# Patient Record
Sex: Male | Born: 1975 | Race: White | Hispanic: No | Marital: Single | Smoking: Current every day smoker
Health system: Southern US, Community
[De-identification: ages and names within clinical notes are randomized; demographics above are authoritative.]

## PROBLEM LIST (undated history)

## (undated) DIAGNOSIS — M1990 Unspecified osteoarthritis, unspecified site: Secondary | ICD-10-CM

## (undated) DIAGNOSIS — E119 Type 2 diabetes mellitus without complications: Secondary | ICD-10-CM

## (undated) DIAGNOSIS — F17200 Nicotine dependence, unspecified, uncomplicated: Secondary | ICD-10-CM

## (undated) DIAGNOSIS — I10 Essential (primary) hypertension: Secondary | ICD-10-CM

---

## 2019-04-05 ENCOUNTER — Other Ambulatory Visit: Payer: Self-pay

## 2019-04-05 ENCOUNTER — Emergency Department: Payer: Medicaid Other

## 2019-04-05 ENCOUNTER — Emergency Department
Admission: EM | Admit: 2019-04-05 | Discharge: 2019-04-06 | Disposition: A | Discharge status: Home / Self Care | Payer: Medicaid Other | Attending: Emergency Medicine | Admitting: Emergency Medicine

## 2019-04-05 ENCOUNTER — Encounter: Payer: Self-pay | Admitting: Emergency Medicine

## 2019-04-05 DIAGNOSIS — S299XXA Unspecified injury of thorax, initial encounter: Secondary | ICD-10-CM | POA: Diagnosis present

## 2019-04-05 DIAGNOSIS — Y92410 Unspecified street and highway as the place of occurrence of the external cause: Secondary | ICD-10-CM | POA: Diagnosis not present

## 2019-04-05 DIAGNOSIS — F10929 Alcohol use, unspecified with intoxication, unspecified: Secondary | ICD-10-CM | POA: Diagnosis not present

## 2019-04-05 DIAGNOSIS — F17210 Nicotine dependence, cigarettes, uncomplicated: Secondary | ICD-10-CM | POA: Insufficient documentation

## 2019-04-05 DIAGNOSIS — Y9389 Activity, other specified: Secondary | ICD-10-CM | POA: Diagnosis not present

## 2019-04-05 DIAGNOSIS — I10 Essential (primary) hypertension: Secondary | ICD-10-CM | POA: Diagnosis not present

## 2019-04-05 DIAGNOSIS — S20213A Contusion of bilateral front wall of thorax, initial encounter: Secondary | ICD-10-CM

## 2019-04-05 DIAGNOSIS — Y999 Unspecified external cause status: Secondary | ICD-10-CM | POA: Diagnosis not present

## 2019-04-05 DIAGNOSIS — Y908 Blood alcohol level of 240 mg/100 ml or more: Secondary | ICD-10-CM | POA: Insufficient documentation

## 2019-04-05 DIAGNOSIS — E119 Type 2 diabetes mellitus without complications: Secondary | ICD-10-CM | POA: Diagnosis not present

## 2019-04-05 DIAGNOSIS — R55 Syncope and collapse: Secondary | ICD-10-CM | POA: Insufficient documentation

## 2019-04-05 DIAGNOSIS — R52 Pain, unspecified: Secondary | ICD-10-CM

## 2019-04-05 HISTORY — DX: Essential (primary) hypertension: I10

## 2019-04-05 HISTORY — DX: Unspecified osteoarthritis, unspecified site: M19.90

## 2019-04-05 HISTORY — DX: Type 2 diabetes mellitus without complications: E11.9

## 2019-04-05 LAB — CBC
HCT: 43.8 % (ref 39.0–52.0)
Hemoglobin: 14.4 g/dL (ref 13.0–17.0)
MCH: 33.1 pg (ref 26.0–34.0)
MCHC: 32.9 g/dL (ref 30.0–36.0)
MCV: 100.7 fL — ABNORMAL HIGH (ref 80.0–100.0)
Platelets: 345 10*3/uL (ref 150–400)
RBC: 4.35 MIL/uL (ref 4.22–5.81)
RDW: 11.8 % (ref 11.5–15.5)
WBC: 9.6 10*3/uL (ref 4.0–10.5)
nRBC: 0 % (ref 0.0–0.2)

## 2019-04-05 LAB — URINALYSIS, COMPLETE (UACMP) WITH MICROSCOPIC
Bacteria, UA: NONE SEEN
Bilirubin Urine: NEGATIVE
Glucose, UA: >=500 mg/dL — AB
Hgb urine dipstick: NEGATIVE
Ketones, ur: NEGATIVE mg/dL
Leukocytes,Ua: NEGATIVE
Nitrite: NEGATIVE
Protein, ur: NEGATIVE mg/dL
Specific Gravity, Urine: 1.01 (ref 1.005–1.030)
Squamous Epithelial / HPF: NONE SEEN (ref 0–5)
pH: 5 (ref 5.0–8.0)

## 2019-04-05 LAB — BASIC METABOLIC PANEL
Anion gap: 18 — ABNORMAL HIGH (ref 5–15)
BUN: 18 mg/dL (ref 6–20)
CO2: 24 mmol/L (ref 22–32)
Calcium: 9.8 mg/dL (ref 8.9–10.3)
Chloride: 90 mmol/L — ABNORMAL LOW (ref 98–111)
Creatinine, Ser: 0.62 mg/dL (ref 0.61–1.24)
GFR calc Af Amer: >60 mL/min (ref >60)
GFR calc non Af Amer: >60 mL/min (ref >60)
Glucose, Bld: 308 mg/dL — ABNORMAL HIGH (ref 70–99)
Potassium: 3.6 mmol/L (ref 3.5–5.1)
Sodium: 132 mmol/L — ABNORMAL LOW (ref 135–145)

## 2019-04-05 LAB — ETHANOL: Alcohol, Ethyl (B): 262 mg/dL — ABNORMAL HIGH (ref <10)

## 2019-04-05 LAB — GLUCOSE, CAPILLARY: Glucose-Capillary: 319 mg/dL — ABNORMAL HIGH (ref 70–99)

## 2019-04-05 MED ORDER — SODIUM CHLORIDE 0.9% FLUSH
3.0000 mL | Freq: Once | INTRAVENOUS | Status: AC
  Administered 2019-04-06: 04:00:00 3 mL via INTRAVENOUS
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
## 2019-04-05 NOTE — ED Notes (Signed)
Received call from CT, that patient ripped off his cervical collar and refusing to put it on.

## 2019-04-06 ENCOUNTER — Emergency Department: Payer: Medicaid Other

## 2019-04-06 ENCOUNTER — Encounter: Payer: Self-pay | Admitting: Radiology

## 2019-04-06 LAB — GLUCOSE, CAPILLARY
Glucose-Capillary: 231 mg/dL — ABNORMAL HIGH (ref 70–99)
Glucose-Capillary: 235 mg/dL — ABNORMAL HIGH (ref 70–99)

## 2019-04-06 MED ORDER — KETOROLAC TROMETHAMINE 30 MG/ML IJ SOLN
30.0000 mg | Freq: Once | INTRAMUSCULAR | Status: AC
  Administered 2019-04-06: 04:00:00 30 mg via INTRAVENOUS
  Filled 2019-04-06: qty 1

## 2019-04-06 MED ORDER — SODIUM CHLORIDE 0.9 % IV BOLUS: 1000.0000 mL | Freq: Once | INTRAVENOUS | Status: DC

## 2019-04-06 MED ORDER — IOHEXOL 300 MG/ML  SOLN
75.0000 mL | Freq: Once | INTRAMUSCULAR | Status: AC | PRN
  Administered 2019-04-06: 03:00:00 75 mL via INTRAVENOUS
  Filled 2019-04-06: qty 75

## 2019-04-06 NOTE — ED Notes (Signed)
Pt using bathroom prior to discharge.

## 2019-04-06 NOTE — ED Notes (Signed)
Pt sleeping, easily aroused. No concerns at this time.
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
## 2021-09-30 ENCOUNTER — Emergency Department: Payer: Self-pay

## 2021-09-30 DIAGNOSIS — Z515 Encounter for palliative care: Secondary | ICD-10-CM

## 2021-09-30 DIAGNOSIS — F419 Anxiety disorder, unspecified: Secondary | ICD-10-CM | POA: Diagnosis present

## 2021-09-30 DIAGNOSIS — E232 Diabetes insipidus: Secondary | ICD-10-CM | POA: Diagnosis present

## 2021-09-30 DIAGNOSIS — J9602 Acute respiratory failure with hypercapnia: Secondary | ICD-10-CM | POA: Diagnosis present

## 2021-09-30 DIAGNOSIS — Z794 Long term (current) use of insulin: Secondary | ICD-10-CM

## 2021-09-30 DIAGNOSIS — K7200 Acute and subacute hepatic failure without coma: Secondary | ICD-10-CM | POA: Diagnosis present

## 2021-09-30 DIAGNOSIS — E870 Hyperosmolality and hypernatremia: Secondary | ICD-10-CM | POA: Diagnosis present

## 2021-09-30 DIAGNOSIS — E876 Hypokalemia: Secondary | ICD-10-CM | POA: Diagnosis present

## 2021-09-30 DIAGNOSIS — I110 Hypertensive heart disease with heart failure: Secondary | ICD-10-CM | POA: Diagnosis present

## 2021-09-30 DIAGNOSIS — R578 Other shock: Secondary | ICD-10-CM | POA: Diagnosis present

## 2021-09-30 DIAGNOSIS — Z66 Do not resuscitate: Secondary | ICD-10-CM | POA: Diagnosis not present

## 2021-09-30 DIAGNOSIS — I462 Cardiac arrest due to underlying cardiac condition: Secondary | ICD-10-CM | POA: Diagnosis present

## 2021-09-30 DIAGNOSIS — I272 Pulmonary hypertension, unspecified: Secondary | ICD-10-CM | POA: Diagnosis present

## 2021-09-30 DIAGNOSIS — I4901 Ventricular fibrillation: Principal | ICD-10-CM | POA: Diagnosis present

## 2021-09-30 DIAGNOSIS — Z529 Donor of unspecified organ or tissue: Secondary | ICD-10-CM

## 2021-09-30 DIAGNOSIS — M1990 Unspecified osteoarthritis, unspecified site: Secondary | ICD-10-CM | POA: Diagnosis present

## 2021-09-30 DIAGNOSIS — G939 Disorder of brain, unspecified: Secondary | ICD-10-CM

## 2021-09-30 DIAGNOSIS — G9382 Brain death: Secondary | ICD-10-CM | POA: Diagnosis present

## 2021-09-30 DIAGNOSIS — J9601 Acute respiratory failure with hypoxia: Secondary | ICD-10-CM | POA: Diagnosis present

## 2021-09-30 DIAGNOSIS — E872 Acidosis, unspecified: Secondary | ICD-10-CM

## 2021-09-30 DIAGNOSIS — F17210 Nicotine dependence, cigarettes, uncomplicated: Secondary | ICD-10-CM | POA: Diagnosis present

## 2021-09-30 DIAGNOSIS — E1110 Type 2 diabetes mellitus with ketoacidosis without coma: Secondary | ICD-10-CM | POA: Diagnosis present

## 2021-09-30 DIAGNOSIS — I4891 Unspecified atrial fibrillation: Secondary | ICD-10-CM | POA: Diagnosis present

## 2021-09-30 DIAGNOSIS — I255 Ischemic cardiomyopathy: Secondary | ICD-10-CM | POA: Diagnosis present

## 2021-09-30 DIAGNOSIS — G928 Other toxic encephalopathy: Secondary | ICD-10-CM | POA: Diagnosis present

## 2021-09-30 DIAGNOSIS — Z20822 Contact with and (suspected) exposure to covid-19: Secondary | ICD-10-CM | POA: Diagnosis present

## 2021-09-30 DIAGNOSIS — I213 ST elevation (STEMI) myocardial infarction of unspecified site: Secondary | ICD-10-CM | POA: Diagnosis present

## 2021-09-30 DIAGNOSIS — Z6833 Body mass index (BMI) 33.0-33.9, adult: Secondary | ICD-10-CM

## 2021-09-30 DIAGNOSIS — I469 Cardiac arrest, cause unspecified: Principal | ICD-10-CM

## 2021-09-30 DIAGNOSIS — E669 Obesity, unspecified: Secondary | ICD-10-CM | POA: Diagnosis present

## 2021-09-30 DIAGNOSIS — G931 Anoxic brain damage, not elsewhere classified: Secondary | ICD-10-CM | POA: Diagnosis present

## 2021-09-30 DIAGNOSIS — G936 Cerebral edema: Secondary | ICD-10-CM | POA: Diagnosis present

## 2021-09-30 DIAGNOSIS — J9811 Atelectasis: Secondary | ICD-10-CM | POA: Diagnosis present

## 2021-09-30 DIAGNOSIS — Z880 Allergy status to penicillin: Secondary | ICD-10-CM

## 2021-09-30 DIAGNOSIS — I5030 Unspecified diastolic (congestive) heart failure: Secondary | ICD-10-CM | POA: Diagnosis present

## 2021-09-30 DIAGNOSIS — I2510 Atherosclerotic heart disease of native coronary artery without angina pectoris: Secondary | ICD-10-CM | POA: Diagnosis present

## 2021-09-30 HISTORY — DX: Nicotine dependence, unspecified, uncomplicated: F17.200

## 2021-09-30 LAB — CBC WITH DIFFERENTIAL/PLATELET
Abs Immature Granulocytes: 1.32 10*3/uL — ABNORMAL HIGH (ref 0.00–0.07)
Basophils Absolute: 0.1 10*3/uL (ref 0.0–0.1)
Basophils Relative: 1 %
Eosinophils Absolute: 0.3 10*3/uL (ref 0.0–0.5)
Eosinophils Relative: 2 %
HCT: 41.6 % (ref 39.0–52.0)
Hemoglobin: 13 g/dL (ref 13.0–17.0)
Immature Granulocytes: 10 %
Lymphocytes Relative: 42 %
Lymphs Abs: 5.3 10*3/uL — ABNORMAL HIGH (ref 0.7–4.0)
MCH: 32.4 pg (ref 26.0–34.0)
MCHC: 31.3 g/dL (ref 30.0–36.0)
MCV: 103.7 fL — ABNORMAL HIGH (ref 80.0–100.0)
Monocytes Absolute: 0.7 10*3/uL (ref 0.1–1.0)
Monocytes Relative: 5 %
Neutro Abs: 5.1 10*3/uL (ref 1.7–7.7)
Neutrophils Relative %: 40 %
Platelets: 293 10*3/uL (ref 150–400)
RBC: 4.01 MIL/uL — ABNORMAL LOW (ref 4.22–5.81)
RDW: 11.9 % (ref 11.5–15.5)
WBC: 12.9 10*3/uL — ABNORMAL HIGH (ref 4.0–10.5)
nRBC: 0.2 % (ref 0.0–0.2)

## 2021-09-30 LAB — CBC
HCT: 42.9 % (ref 39.0–52.0)
Hemoglobin: 13 g/dL (ref 13.0–17.0)
MCH: 31.9 pg (ref 26.0–34.0)
MCHC: 30.3 g/dL (ref 30.0–36.0)
MCV: 105.1 fL — ABNORMAL HIGH (ref 80.0–100.0)
Platelets: 281 10*3/uL (ref 150–400)
RBC: 4.08 MIL/uL — ABNORMAL LOW (ref 4.22–5.81)
RDW: 11.9 % (ref 11.5–15.5)
WBC: 10.8 10*3/uL — ABNORMAL HIGH (ref 4.0–10.5)
nRBC: 0.3 % — ABNORMAL HIGH (ref 0.0–0.2)

## 2021-09-30 LAB — BRAIN NATRIURETIC PEPTIDE: B Natriuretic Peptide: 61.4 pg/mL (ref 0.0–100.0)

## 2021-09-30 LAB — PROTIME-INR
INR: 1.2 (ref 0.8–1.2)
INR: 1.2 (ref 0.8–1.2)
Prothrombin Time: 14.8 seconds (ref 11.4–15.2)
Prothrombin Time: 14.8 seconds (ref 11.4–15.2)

## 2021-09-30 LAB — SALICYLATE LEVEL: Salicylate Lvl: <7 mg/dL — ABNORMAL LOW (ref 7.0–30.0)

## 2021-09-30 LAB — CBG MONITORING, ED: Glucose-Capillary: 496 mg/dL — ABNORMAL HIGH (ref 70–99)

## 2021-09-30 LAB — APTT
aPTT: 41 seconds — ABNORMAL HIGH (ref 24–36)
aPTT: 48 seconds — ABNORMAL HIGH (ref 24–36)

## 2021-09-30 LAB — ACETAMINOPHEN LEVEL: Acetaminophen (Tylenol), Serum: <10 ug/mL — ABNORMAL LOW (ref 10–30)

## 2021-09-30 MED ORDER — EPINEPHRINE 1 MG/10ML IJ SOSY
PREFILLED_SYRINGE | INTRAMUSCULAR | Status: DC | PRN
  Administered 2021-09-30: 1 mg via INTRAVENOUS

## 2021-09-30 MED ORDER — EPINEPHRINE 1 MG/10ML IJ SOSY
PREFILLED_SYRINGE | INTRAMUSCULAR | Status: AC | PRN
  Administered 2021-09-30: 1 mg via INTRAVENOUS

## 2021-09-30 MED ORDER — SODIUM CHLORIDE 0.9 % IV SOLN
INTRAVENOUS | Status: AC | PRN
Start: 2021-09-30 — End: 2021-09-30
  Administered 2021-09-30 (×2): 1000 mL via INTRAVENOUS

## 2021-09-30 MED ORDER — SODIUM BICARBONATE 8.4 % IV SOLN
INTRAVENOUS | Status: AC | PRN
  Administered 2021-09-30 (×2): 50 meq via INTRAVENOUS

## 2021-09-30 MED ORDER — EPINEPHRINE 1 MG/10ML IJ SOSY
PREFILLED_SYRINGE | INTRAMUSCULAR | Status: AC
  Filled 2021-09-30: qty 10

## 2021-09-30 MED ORDER — EPINEPHRINE HCL 5 MG/250ML IV SOLN IN NS
0.5000 ug/min | INTRAVENOUS | Status: DC
  Administered 2021-09-30 – 2021-10-01 (×2): 10 ug/min via INTRAVENOUS
  Administered 2021-10-01 (×2): 12 ug/min via INTRAVENOUS
  Administered 2021-10-01: 5 ug/min via INTRAVENOUS
  Administered 2021-10-02: 15 ug/min via INTRAVENOUS
  Administered 2021-10-02: 12 ug/min via INTRAVENOUS
  Filled 2021-09-30 (×6): qty 250

## 2021-09-30 MED ORDER — NOREPINEPHRINE 4 MG/250ML-% IV SOLN
INTRAVENOUS | Status: AC | PRN
  Administered 2021-09-30: 10 ug/min via INTRAVENOUS

## 2021-09-30 MED ORDER — SODIUM CHLORIDE 0.9 % IV SOLN: INTRAVENOUS | Status: DC
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
## 2021-09-30 NOTE — ED Triage Notes (Signed)
Pt arrives in cardiac arrest. See code blue narrator.  ?
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
## 2021-09-30 NOTE — Code Documentation (Signed)
[place] placed R [place] RN ED ?

## 2021-09-30 NOTE — Code Documentation (Signed)
Cbg 496 ?

## 2021-09-30 NOTE — Code Documentation (Signed)
1MG  epi given  ?

## 2021-09-30 NOTE — Code Documentation (Signed)
1G calcium given  ?

## 2021-09-30 NOTE — ED Notes (Signed)
Activated Code Stemi per Dr. Fuller Plan ?

## 2021-09-30 NOTE — Code Documentation (Signed)
Pulse check CPR resumed

## 2021-09-30 NOTE — ED Notes (Signed)
50mg TNK given 

## 2021-09-30 NOTE — Consult Note (Signed)
[facility] Cardiology ? ?CARDIOLOGY CONSULT NOTE  ?Patient ID: ?Jose Winters ?MRN: [id number] ?DOB/AGE: 1976/05/20 45 y.o. ? ?Admit date: 09/23/2021 ?Referring Physician Fuller Plan ?Primary Physician [facility] primary ?Primary Cardiologist  ?Reason for Consultation cardiopulmonary arrest ? ?HPI: 46 year old gentleman referred for possible code STEMI.  3 obtained from patient's sister.  The patient and his sister were out earlier this evening, had several alcoholic beverages.  He returned to his apartment, ate dinner, was with friends, had more alcoholic beverages and possible illicit drugs, when he became pulseless.  EMS was called, upon arrival is noted to be in PEA, CPR and epinephrine x2 were given, developed ventricular fibrillation, shocked twice, given amiodarone, and was brought to [facility] ED, where again developed PEA, treated with epinephrine x2 and 1 g of calcium, started on Levophed infusion, and intubated.  ECG revealed ectopic atrial tachycardia, diffuse ST-T wave abnormalities, without definitive and diagnostic ST elevation.  The patient was treated with TNKase thrombolytics.  Lab work revealed alcohol level 262.  Head CT consistent with cerebral edema.  Patient obtunded with pupils fixed and dilated. ? ?Review of systems complete and found to be negative unless listed above  ? ? ? ?Past Medical History:  ?Diagnosis Date  ? Arthritis   ? Diabetes (HCC)   ? Hypertension   ?  ?No past surgical history on file.  ?(Not in a hospital admission) ? ?Social History  ? ?Socioeconomic History  ? Marital status: Single  ?  Spouse name: Not on file  ? Number of children: Not on file  ? Years of education: Not on file  ? Highest education level: Not on file  ?Occupational History  ? Not on file  ?Tobacco Use  ? Smoking status: Every Day  ?  Packs/day: 0.50  ?  Types: Cigarettes  ? Smokeless tobacco: Never  ?Substance and Sexual Activity  ? Alcohol use: Yes  ? Drug use: Yes  ?  Types: Marijuana  ?  Comment: last smoked earlier today  ?  Sexual activity: Not on file  ?Other Topics Concern  ? Not on file  ?Social History Narrative  ? Not on file  ? ?Social Determinants of Health  ? ?Financial Resource Strain: Not on file  ?Food Insecurity: Not on file  ?Transportation Needs: Not on file  ?Physical Activity: Not on file  ?Stress: Not on file  ?Social Connections: Not on file  ?Intimate Partner Violence: Not on file  ?  ?No family history on file.  ? ? ?Review of systems complete and found to be negative unless listed above  ? ? ? ? ?PHYSICAL EXAM ? ?General: Well developed, well nourished, in no acute distress ?HEENT:  Normocephalic and atramatic ?Neck:  No JVD.  ?Lungs: Clear bilaterally to auscultation and percussion. ?Heart: HRRR . Normal S1 and S2 without gallops or murmurs.  ?Abdomen: Bowel sounds are positive, abdomen soft and non-tender  ?Msk:  Back normal, normal gait. Normal strength and tone for age. ?Extremities: No clubbing, cyanosis or edema.   ?Neuro: Alert and oriented X 3. ?Psych:  Good affect, responds appropriately ? ?Labs: ?  ?Lab Results  ?Component Value Date  ? WBC 12.9 (H) 09/14/2021  ? HGB 13.0 09/08/2021  ? HCT 41.6 09/17/2021  ? MCV 103.7 (H) 10/02/2021  ? PLT 293 09/07/2021  ? No results for input(s): NA, K, CL, CO2, BUN, CREATININE, CALCIUM, PROT, BILITOT, ALKPHOS, ALT, AST, GLUCOSE in the last 168 hours. ? ?Invalid input(s): LABALBU ?No results found for: CKTOTAL, CKMB, CKMBINDEX, TROPONINI  No results found for: CHOL ?No results found for: HDL ?No results found for: LDLCALC ?No results found for: TRIG ?No results found for: CHOLHDL ?No results found for: LDLDIRECT  ?  ?Radiology: CT HEAD WO CONTRAST ( ) ? ?Result Date: 09/18/2021 ?CLINICAL DATA:  Sudden onset of severe headache.  Post CPR. EXAM: CT HEAD WITHOUT CONTRAST TECHNIQUE: Contiguous axial images were obtained from the base of the skull through the vertex without intravenous contrast. RADIATION DOSE REDUCTION: This exam was performed according to the departmental  dose-optimization program which includes automated exposure control, adjustment of the mA and/or kV according to patient size and/or use of iterative reconstruction technique. COMPARISON:  Head CT 04/05/2019 FINDINGS: Brain: There is diffuse sulcal effacement and loss of gray-white differentiation throughout the cerebral hemispheres. Pseudo subarachnoid hemorrhage with increased density of the subarachnoid space typical of cerebral edema. No hemorrhage or acute infarct. No subdural collection. Lateral ventricles are smaller than on prior CT. The basilar cisterns remain patent. Vascular: No focal hyperdense vessel. Skull: No fracture or focal lesion. Sinuses/Orbits: Partial opacification of left mastoid air cells. Mucosal thickening throughout the ethmoid air cells and sphenoid sinuses. Other: Left frontal scalp scarring. IMPRESSION: Diffuse sulcal effacement and loss of gray-white differentiation throughout the cerebral hemispheres, highly suspicious for cerebral edema. These results were called by telephone at the time of interpretation on 09/24/2021 at 11:32 pm to provider [facility] , who verbally acknowledged these results. Electronically Signed   By: Narda Rutherford M.D.   On: 09/29/2021 23:34  ? ?CT Cervical Spine Wo Contrast ? ?Result Date: 09/29/2021 ?CLINICAL DATA:  Ataxia, cervical trauma Post CPR. EXAM: CT CERVICAL SPINE WITHOUT CONTRAST TECHNIQUE: Multidetector CT imaging of the cervical spine was performed without intravenous contrast. Multiplanar CT image reconstructions were also generated. RADIATION DOSE REDUCTION: This exam was performed according to the departmental dose-optimization program which includes automated exposure control, adjustment of the mA and/or kV according to patient size and/or use of iterative reconstruction technique. COMPARISON:  04/05/2019 FINDINGS: Alignment: Straightening of normal lordosis. No traumatic subluxation. Skull base and vertebrae: No acute fracture. No primary  bone lesion or focal pathologic process. Soft tissues and spinal canal: No prevertebral fluid or swelling. No visible canal hematoma. Disc levels: Mild anterior spurring at multiple levels. C5-C6 disc space narrowing. Upper chest: Enteric tube in place. Orogastric tube is looped in the pharynx. No acute apical lung findings. Other: None. IMPRESSION: 1. No acute fracture or traumatic subluxation of the cervical spine. 2. Straightening of normal lordosis may be due to positioning or muscle spasm. 3. Orogastric tube is looped in the pharynx, but extends into the visualized esophagus. Electronically Signed   By: Narda Rutherford M.D.   On: 10/01/2021 23:37   ? ?EKG: Ectopic atrial tachycardia with diffuse ST-T wave abnormalities inferolaterally without diagnostic ST elevation ? ?ASSESSMENT AND PLAN:  ? ?1.  Cardiopulmonary arrest, following alcohol binge and possible drugs, initially PEA, followed by A-fib after epinephrine, total CPR approximately 40 minutes, patient obtunded with pupils fixed and dilated, CT revealing cerebral edema, EKG nondiagnostic for STEMI, patient given TNKase during CPR.  At this time, no clear indication for urgent cardiac catheterization, especially after receiving thrombolytics. ? ?Recommendations ? ?1.  Agree with current therapy ?2.  Defer cardiac catheterization at this time ?3.  2D echocardiogram ?4.  Further recommendations pending patient's initial clinical course and 2D echocardiogram results ? ?Signed: ?Marcina Millard MD,PhD, FACC ?09/05/2021, 11:54 PM ? ? ? ? ?  ?

## 2021-10-01 ENCOUNTER — Inpatient Hospital Stay: Payer: Self-pay

## 2021-10-01 ENCOUNTER — Inpatient Hospital Stay
Admit: 2021-10-01 | Discharge: 2021-10-01 | Disposition: A | Discharge status: Home / Self Care | Payer: Self-pay | Attending: Cardiology | Admitting: Cardiology

## 2021-10-01 DIAGNOSIS — I469 Cardiac arrest, cause unspecified: Secondary | ICD-10-CM

## 2021-10-01 DIAGNOSIS — R578 Other shock: Secondary | ICD-10-CM

## 2021-10-01 LAB — HEPATIC FUNCTION PANEL
ALT: 102 U/L — ABNORMAL HIGH (ref 0–44)
AST: 231 U/L — ABNORMAL HIGH (ref 15–41)
Albumin: 2.6 g/dL — ABNORMAL LOW (ref 3.5–5.0)
Alkaline Phosphatase: 106 U/L (ref 38–126)
Bilirubin, Direct: <0.1 mg/dL (ref 0.0–0.2)
Total Bilirubin: 0.5 mg/dL (ref 0.3–1.2)
Total Protein: 5.6 g/dL — ABNORMAL LOW (ref 6.5–8.1)

## 2021-10-01 LAB — ETHANOL
Alcohol, Ethyl (B): 169 mg/dL — ABNORMAL HIGH (ref <10)
Alcohol, Ethyl (B): <10 mg/dL (ref <10)

## 2021-10-01 LAB — ECHOCARDIOGRAM COMPLETE
AR max vel: 1.78 cm2
AV Peak grad: 6 mmHg
Ao pk vel: 1.22 m/s
Area-P 1/2: 3.77 cm2
Height: 69 in
S' Lateral: 2.93 cm
Weight: 3580.27 oz

## 2021-10-01 LAB — GLUCOSE, CAPILLARY
Glucose-Capillary: 442 mg/dL — ABNORMAL HIGH (ref 70–99)
Glucose-Capillary: 338 mg/dL — ABNORMAL HIGH (ref 70–99)
Glucose-Capillary: 417 mg/dL — ABNORMAL HIGH (ref 70–99)
Glucose-Capillary: 269 mg/dL — ABNORMAL HIGH (ref 70–99)
Glucose-Capillary: 262 mg/dL — ABNORMAL HIGH (ref 70–99)
Glucose-Capillary: 165 mg/dL — ABNORMAL HIGH (ref 70–99)
Glucose-Capillary: 180 mg/dL — ABNORMAL HIGH (ref 70–99)
Glucose-Capillary: 170 mg/dL — ABNORMAL HIGH (ref 70–99)
Glucose-Capillary: 201 mg/dL — ABNORMAL HIGH (ref 70–99)
Glucose-Capillary: 176 mg/dL — ABNORMAL HIGH (ref 70–99)
Glucose-Capillary: 164 mg/dL — ABNORMAL HIGH (ref 70–99)
Glucose-Capillary: 157 mg/dL — ABNORMAL HIGH (ref 70–99)
Glucose-Capillary: 168 mg/dL — ABNORMAL HIGH (ref 70–99)

## 2021-10-01 LAB — COMPREHENSIVE METABOLIC PANEL
ALT: 54 U/L — ABNORMAL HIGH (ref 0–44)
AST: 88 U/L — ABNORMAL HIGH (ref 15–41)
Albumin: 3 g/dL — ABNORMAL LOW (ref 3.5–5.0)
Alkaline Phosphatase: 99 U/L (ref 38–126)
Anion gap: 22 — ABNORMAL HIGH (ref 5–15)
BUN: 9 mg/dL (ref 6–20)
CO2: 18 mmol/L — ABNORMAL LOW (ref 22–32)
Calcium: 11.7 mg/dL — ABNORMAL HIGH (ref 8.9–10.3)
Chloride: 91 mmol/L — ABNORMAL LOW (ref 98–111)
Creatinine, Ser: 1.1 mg/dL (ref 0.61–1.24)
GFR, Estimated: >60 mL/min (ref >60)
Glucose, Bld: 559 mg/dL (ref 70–99)
Potassium: 3.1 mmol/L — ABNORMAL LOW (ref 3.5–5.1)
Sodium: 131 mmol/L — ABNORMAL LOW (ref 135–145)
Total Bilirubin: 0.5 mg/dL (ref 0.3–1.2)
Total Protein: 6.2 g/dL — ABNORMAL LOW (ref 6.5–8.1)

## 2021-10-01 LAB — BASIC METABOLIC PANEL
Anion gap: 13 (ref 5–15)
Anion gap: 9 (ref 5–15)
Anion gap: 8 (ref 5–15)
BUN: 12 mg/dL (ref 6–20)
BUN: 12 mg/dL (ref 6–20)
BUN: 12 mg/dL (ref 6–20)
CO2: 16 mmol/L — ABNORMAL LOW (ref 22–32)
CO2: 27 mmol/L (ref 22–32)
CO2: 26 mmol/L (ref 22–32)
Calcium: 7.8 mg/dL — ABNORMAL LOW (ref 8.9–10.3)
Calcium: 8.2 mg/dL — ABNORMAL LOW (ref 8.9–10.3)
Calcium: 7.9 mg/dL — ABNORMAL LOW (ref 8.9–10.3)
Chloride: 106 mmol/L (ref 98–111)
Chloride: 103 mmol/L (ref 98–111)
Chloride: 117 mmol/L — ABNORMAL HIGH (ref 98–111)
Creatinine, Ser: 1.04 mg/dL (ref 0.61–1.24)
Creatinine, Ser: 1.26 mg/dL — ABNORMAL HIGH (ref 0.61–1.24)
Creatinine, Ser: 1.13 mg/dL (ref 0.61–1.24)
GFR, Estimated: >60 mL/min (ref >60)
GFR, Estimated: >60 mL/min (ref >60)
GFR, Estimated: >60 mL/min (ref >60)
Glucose, Bld: 313 mg/dL — ABNORMAL HIGH (ref 70–99)
Glucose, Bld: 255 mg/dL — ABNORMAL HIGH (ref 70–99)
Glucose, Bld: 189 mg/dL — ABNORMAL HIGH (ref 70–99)
Potassium: 7.2 mmol/L (ref 3.5–5.1)
Potassium: 3 mmol/L — ABNORMAL LOW (ref 3.5–5.1)
Potassium: 2.7 mmol/L — CL (ref 3.5–5.1)
Sodium: 135 mmol/L (ref 135–145)
Sodium: 139 mmol/L (ref 135–145)
Sodium: 151 mmol/L — ABNORMAL HIGH (ref 135–145)

## 2021-10-01 LAB — URINE DRUG SCREEN, QUALITATIVE (ARMC ONLY)
Amphetamines, Ur Screen: NOT DETECTED
Barbiturates, Ur Screen: NOT DETECTED
Benzodiazepine, Ur Scrn: NOT DETECTED
Cannabinoid 50 Ng, Ur ~~LOC~~: POSITIVE — AB
Cocaine Metabolite,Ur ~~LOC~~: NOT DETECTED
MDMA (Ecstasy)Ur Screen: NOT DETECTED
Methadone Scn, Ur: NOT DETECTED
Opiate, Ur Screen: NOT DETECTED
Phencyclidine (PCP) Ur S: NOT DETECTED
Tricyclic, Ur Screen: NOT DETECTED

## 2021-10-01 LAB — BLOOD GAS, ARTERIAL
Acid-base deficit: 16.9 mmol/L — ABNORMAL HIGH (ref 0.0–2.0)
Acid-base deficit: 2.3 mmol/L — ABNORMAL HIGH (ref 0.0–2.0)
Bicarbonate: 8.6 mmol/L — ABNORMAL LOW (ref 20.0–28.0)
Bicarbonate: 25.6 mmol/L (ref 20.0–28.0)
FIO2: 40 %
FIO2: 40 %
MECHVT: 500 mL
MECHVT: 500 mL
O2 Saturation: 99.8 %
O2 Saturation: 93.4 %
PEEP: 5 cmH2O
PEEP: 5 cmH2O
Patient temperature: 37
Patient temperature: 37
RATE: 18 resp/min
RATE: 18 resp/min
pCO2 arterial: 21 mmHg — ABNORMAL LOW (ref 32–48)
pCO2 arterial: 57 mmHg — ABNORMAL HIGH (ref 32–48)
pH, Arterial: 7.22 — ABNORMAL LOW (ref 7.35–7.45)
pH, Arterial: 7.26 — ABNORMAL LOW (ref 7.35–7.45)
pO2, Arterial: 206 mmHg — ABNORMAL HIGH (ref 83–108)
pO2, Arterial: 67 mmHg — ABNORMAL LOW (ref 83–108)

## 2021-10-01 LAB — URINALYSIS, ROUTINE W REFLEX MICROSCOPIC
Bilirubin Urine: NEGATIVE
Glucose, UA: >=500 mg/dL — AB
Ketones, ur: 5 mg/dL — AB
Leukocytes,Ua: NEGATIVE
Nitrite: NEGATIVE
Protein, ur: 100 mg/dL — AB
RBC / HPF: >50 RBC/hpf — ABNORMAL HIGH (ref 0–5)
Specific Gravity, Urine: 1.014 (ref 1.005–1.030)
WBC, UA: >50 WBC/hpf — ABNORMAL HIGH (ref 0–5)
pH: 6 (ref 5.0–8.0)

## 2021-10-01 LAB — MAGNESIUM
Magnesium: 3.1 mg/dL — ABNORMAL HIGH (ref 1.7–2.4)
Magnesium: 1.7 mg/dL (ref 1.7–2.4)
Magnesium: 1.7 mg/dL (ref 1.7–2.4)

## 2021-10-01 LAB — PHOSPHORUS
Phosphorus: 11 mg/dL — ABNORMAL HIGH (ref 2.5–4.6)
Phosphorus: 1.3 mg/dL — ABNORMAL LOW (ref 2.5–4.6)
Phosphorus: 6.2 mg/dL — ABNORMAL HIGH (ref 2.5–4.6)

## 2021-10-01 LAB — RESP PANEL BY RT-PCR (FLU A&B, COVID) ARPGX2
Influenza A by PCR: NEGATIVE
Influenza B by PCR: NEGATIVE
SARS Coronavirus 2 by RT PCR: NEGATIVE

## 2021-10-01 LAB — BETA-HYDROXYBUTYRIC ACID
Beta-Hydroxybutyric Acid: 1.37 mmol/L — ABNORMAL HIGH (ref 0.05–0.27)
Beta-Hydroxybutyric Acid: 0.55 mmol/L — ABNORMAL HIGH (ref 0.05–0.27)

## 2021-10-01 LAB — POTASSIUM: Potassium: 4.6 mmol/L (ref 3.5–5.1)

## 2021-10-01 LAB — TROPONIN I (HIGH SENSITIVITY)
Troponin I (High Sensitivity): 279 ng/L (ref <18)
Troponin I (High Sensitivity): 46 ng/L — ABNORMAL HIGH (ref <18)
Troponin I (High Sensitivity): 1955 ng/L (ref <18)
Troponin I (High Sensitivity): 2637 ng/L (ref <18)

## 2021-10-01 LAB — PROCALCITONIN: Procalcitonin: 6.61 ng/mL

## 2021-10-01 LAB — HEMOGLOBIN A1C
Hgb A1c MFr Bld: 9.6 % — ABNORMAL HIGH (ref 4.8–5.6)
Mean Plasma Glucose: 228.82 mg/dL

## 2021-10-01 LAB — LACTIC ACID, PLASMA
Lactic Acid, Venous: >9 mmol/L (ref 0.5–1.9)
Lactic Acid, Venous: >9 mmol/L (ref 0.5–1.9)

## 2021-10-01 LAB — HIV ANTIBODY (ROUTINE TESTING W REFLEX): HIV Screen 4th Generation wRfx: NONREACTIVE

## 2021-10-01 MED ORDER — STERILE WATER FOR INJECTION IV SOLN
INTRAMUSCULAR | Status: DC
  Filled 2021-10-01: qty 150
  Filled 2021-10-01: qty 1000

## 2021-10-01 MED ORDER — INSULIN REGULAR(HUMAN) IN NACL 100-0.9 UT/100ML-% IV SOLN
INTRAVENOUS | Status: DC
  Administered 2021-10-01: 8.5 [IU]/h via INTRAVENOUS
  Filled 2021-10-01 (×2): qty 100

## 2021-10-01 MED ORDER — SODIUM CHLORIDE 0.9 % IV SOLN: 2.0000 g | Freq: Once | INTRAVENOUS | Status: DC

## 2021-10-01 MED ORDER — DOCUSATE SODIUM 100 MG PO CAPS: 100.0000 mg | ORAL_CAPSULE | Freq: Two times a day (BID) | ORAL | Status: DC | PRN

## 2021-10-01 MED ORDER — NOREPINEPHRINE 4 MG/250ML-% IV SOLN: 0.0000 ug/min | INTRAVENOUS | Status: DC

## 2021-10-01 MED ORDER — DOCUSATE SODIUM 50 MG/5ML PO LIQD
100.0000 mg | Freq: Two times a day (BID) | ORAL | Status: DC
  Administered 2021-10-01: 100 mg
  Filled 2021-10-01: qty 10

## 2021-10-01 MED ORDER — POLYETHYLENE GLYCOL 3350 17 G PO PACK
17.0000 g | PACK | Freq: Every day | ORAL | Status: DC
  Administered 2021-10-01: 17 g
  Filled 2021-10-01: qty 1

## 2021-10-01 MED ORDER — SODIUM CHLORIDE 3 % IV BOLUS
100.0000 mL | Freq: Once | INTRAVENOUS | Status: AC
  Administered 2021-10-01: 100 mL via INTRAVENOUS
  Filled 2021-10-01: qty 500

## 2021-10-01 MED ORDER — TENECTEPLASE 50 MG IV KIT
50.0000 mg | PACK | INTRAVENOUS | Status: AC
  Administered 2021-09-30: 50 mg via INTRAVENOUS
  Filled 2021-10-01: qty 10

## 2021-10-01 MED ORDER — SODIUM CHLORIDE 3 % IV SOLN
INTRAVENOUS | Status: DC
  Filled 2021-10-01 (×4): qty 500

## 2021-10-01 MED ORDER — MAGNESIUM SULFATE 2 GM/50ML IV SOLN
2.0000 g | Freq: Once | INTRAVENOUS | Status: AC
  Administered 2021-10-01: 2 g via INTRAVENOUS
  Filled 2021-10-01: qty 50

## 2021-10-01 MED ORDER — NOREPINEPHRINE 4 MG/250ML-% IV SOLN
INTRAVENOUS | Status: AC
  Administered 2021-10-01: 5 ug/min via INTRAVENOUS
  Filled 2021-10-01: qty 250

## 2021-10-01 MED ORDER — INSULIN ASPART 100 UNIT/ML IJ SOLN: 0.0000 [IU] | INTRAMUSCULAR | Status: DC

## 2021-10-01 MED ORDER — FENTANYL CITRATE PF 50 MCG/ML IJ SOSY: 50.0000 ug | PREFILLED_SYRINGE | INTRAMUSCULAR | Status: DC | PRN

## 2021-10-01 MED ORDER — POTASSIUM PHOSPHATES 15 MMOLE/5ML IV SOLN
45.0000 mmol | Freq: Once | INTRAVENOUS | Status: AC
  Administered 2021-10-01: 45 mmol via INTRAVENOUS
  Filled 2021-10-01: qty 15

## 2021-10-01 MED ORDER — SODIUM BICARBONATE 8.4 % IV SOLN
50.0000 meq | Freq: Once | INTRAVENOUS | Status: AC
  Administered 2021-10-01: 50 meq via INTRAVENOUS
  Filled 2021-10-01: qty 50

## 2021-10-01 MED ORDER — SODIUM CHLORIDE 0.9 % IV SOLN
2.0000 g | INTRAVENOUS | Status: DC
  Administered 2021-10-01 – 2021-10-05 (×5): 2 g via INTRAVENOUS
  Filled 2021-10-01: qty 20
  Filled 2021-10-01 (×5): qty 2

## 2021-10-01 MED ORDER — LACTATED RINGERS IV BOLUS: 20.0000 mL/kg | Freq: Once | INTRAVENOUS | Status: DC

## 2021-10-01 MED ORDER — VASOPRESSIN 20 UNITS/100 ML INFUSION FOR SHOCK
0.0000 [IU]/min | INTRAVENOUS | Status: DC
  Filled 2021-10-01: qty 100

## 2021-10-01 MED ORDER — NOREPINEPHRINE 16 MG/250ML-% IV SOLN
0.0000 ug/min | INTRAVENOUS | Status: DC
  Administered 2021-10-01: 15 ug/min via INTRAVENOUS
  Administered 2021-10-02: 20 ug/min via INTRAVENOUS
  Administered 2021-10-02: 40 ug/min via INTRAVENOUS
  Filled 2021-10-01 (×6): qty 250

## 2021-10-01 MED ORDER — MIDAZOLAM HCL 2 MG/2ML IJ SOLN: 2.0000 mg | INTRAMUSCULAR | Status: DC | PRN

## 2021-10-01 MED ORDER — SODIUM CHLORIDE 0.9 % IV BOLUS
2000.0000 mL | Freq: Once | INTRAVENOUS | Status: AC
  Administered 2021-10-01: 2000 mL via INTRAVENOUS

## 2021-10-01 MED ORDER — INSULIN ASPART 100 UNIT/ML IJ SOLN
0.0000 [IU] | INTRAMUSCULAR | Status: DC
  Administered 2021-10-01: 2 [IU] via SUBCUTANEOUS
  Administered 2021-10-02: 5 [IU] via SUBCUTANEOUS
  Administered 2021-10-02: 2 [IU] via SUBCUTANEOUS
  Administered 2021-10-02: 3 [IU] via SUBCUTANEOUS
  Administered 2021-10-02: 2 [IU] via SUBCUTANEOUS
  Administered 2021-10-02 – 2021-10-03 (×2): 5 [IU] via SUBCUTANEOUS
  Administered 2021-10-03 (×2): 7 [IU] via SUBCUTANEOUS
  Administered 2021-10-03 (×3): 5 [IU] via SUBCUTANEOUS
  Administered 2021-10-04: 7 [IU] via SUBCUTANEOUS
  Filled 2021-10-01 (×13): qty 1

## 2021-10-01 MED ORDER — DEXTROSE 50 % IV SOLN: 0.0000 mL | INTRAVENOUS | Status: DC | PRN

## 2021-10-01 MED ORDER — SODIUM CHLORIDE 0.9 % IV SOLN: INTRAVENOUS | Status: DC

## 2021-10-01 MED ORDER — CHLORHEXIDINE GLUCONATE CLOTH 2 % EX PADS
6.0000 | MEDICATED_PAD | Freq: Every day | CUTANEOUS | Status: DC
  Administered 2021-10-01 – 2021-10-05 (×5): 6 via TOPICAL

## 2021-10-01 MED ORDER — POLYETHYLENE GLYCOL 3350 17 G PO PACK: 17.0000 g | PACK | Freq: Every day | ORAL | Status: DC | PRN

## 2021-10-01 MED ORDER — POTASSIUM CHLORIDE 10 MEQ/100ML IV SOLN
10.0000 meq | INTRAVENOUS | Status: DC
  Administered 2021-10-01 (×2): 10 meq via INTRAVENOUS
  Filled 2021-10-01 (×4): qty 100

## 2021-10-01 MED ORDER — LACTATED RINGERS IV SOLN: INTRAVENOUS | Status: DC

## 2021-10-01 MED ORDER — PANTOPRAZOLE 2 MG/ML SUSPENSION
40.0000 mg | Freq: Every day | ORAL | Status: DC
  Administered 2021-10-01: 40 mg
  Filled 2021-10-01 (×2): qty 20

## 2021-10-01 MED ORDER — DEXTROSE IN LACTATED RINGERS 5 % IV SOLN: INTRAVENOUS | Status: DC

## 2021-10-01 MED FILL — Medication: Qty: 1 | Status: AC

## 2021-10-01 NOTE — Progress Notes (Signed)
Pharmacy Electrolyte Monitoring Consult: ? ?Pharmacy consulted to assist in monitoring and replacing electrolytes in this 46 y.o. male admitted on 09/28/2021 with Cardiac Arrest ? ? ?Labs: ? ?Sodium (mmol/L)  ?Date Value  ?10/01/2021 139  ? ?Potassium (mmol/L)  ?Date Value  ?10/01/2021 3.0 (L)  ? ?Magnesium (mg/dL)  ?Date Value  ?10/01/2021 1.7  ? ?Phosphorus (mg/dL)  ?Date Value  ?10/01/2021 1.3 (L)  ? ?Calcium (mg/dL)  ?Date Value  ?10/01/2021 8.2 (L)  ? ?Albumin (g/dL)  ?Date Value  ?10/01/2021 2.61 (L)  ? ?46 year old male S/P cardiac arrest with successful resuscitation, now with hyponatremia / cerebral edema. 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 ? ?-also on 3%NaCL drip at 75 ml/hr ? ?Assessment/Plan: ?K 3.0 Mag 1.7  Phos 1.3  Na 139  Scr 1.26 ?-Magnesium sulfate 2 gm IV x 1 ordered by NP ?-Potassium Phosphate 45 mmol IV x1 ordered by NP ?-f/u Phos @ 2000 (ordered by NP) ?-f/u electrolytes with am labs ? ?Tamario Heal A ?10/01/2021 ?10:36 AM ?                     ? ? ? ? ?  ?

## 2021-10-01 NOTE — ED Notes (Signed)
Next of Kin-Sister Gust Rung [phone] ?

## 2021-10-01 NOTE — H&P (Signed)
? ?NAME:  Jose Winters, MRN:  [id number], DOB:  Jan 29, 1976, LOS: 0 ?ADMISSION DATE:  09/23/2021, CONSULTATION DATE:  10/01/21 ?REFERRING MD:  Dr. Fuller Plan, CHIEF COMPLAINT:  Cardiac Arrest  ? ?History of Present Illness:  ?46 yo M presenting to [facility] ED on 09/28/2021 with CPR in progress after becoming unresponsive at a neighbor's house. Sister, [name], who arrived bedside stated she saw her brother earlier in the evening for "a couple of beers" at a local restaurant/bar. She reported he had no complaints, and was in his usual state of health. EMS reported upon their arrival he had pulses and was agonally breathing. Patient deteriorated into PEA, 2 episodes of V-fib with 2 defibrillations, he received 300 mg of amio. With ROSC, EKG was concerning for a STEMI. Pulses were lost again en route. ?ED course: ?Upon arrival ED continued ACLS measures until ROSC. Patient emergently intubated on mechanical ventilation. Concern for ischemia and CODE STEMI called. Patient received TNKase just prior to ROSC due to concern for STEMI. CTH negative for ICH but concerning for anoxic injury. Dr. Darrold Junker assessed the patient bedside, deciding he was not a candidate for cardiac cath at this time.  ?Medications given: TNKase, 1 L IVF, sodium bicarb, ACLS medications, epinephrine & levophed drips started ?Initial Vitals: 94.1, 21, 84, 100/56, 96% on BVM ?Significant labs: (Labs/ Imaging personally reviewed) ?I, Cheryll Cockayne Rust-Chester, AGACNP-BC, personally viewed and interpreted this ECG. ?EKG Interpretation: Date: 09/14/2021, EKG Time: 23:36, Rate: 101, Rhythm: NSR, QRS Axis:  normal, Intervals: normal, ST/T Wave abnormalities: non specific T wave inversion leads: I, II/ mild STE in I, II & mild ST depression in V5-V6, Narrative Interpretation: NSR with concern for ischemia ?Chemistry: Na+: 131, K+: 3.1, BUN/Cr.: 9/1.10, Serum CO2/ AG: 18/22, AST: 88, ALT: 54, Ca: 11.7, Phos:11, Mg: 3.1 ?Hematology: WBC: 12.9, Hgb: 13,  ?Troponin: 46 >279 ,  BNP: 61.4, Lactic/ PCT: >9/pending, BAL: 169, tylenol & ASA levels WNL ?COVID-19 & Influenza A/B: negative ?ABG: 7.22/ 21/ 206/ 8.6 ?UDS: + Marijuana ?UA: +ketones, +pyuria ?CXR 09/20/2021: mild bibasilar atelectasis ?CT head wo contrast 09/09/2021: Diffuse sulcal effacement and loss of gray-white differentiation throughout the cerebral hemispheres, highly suspicious for cerebral ?edema. ?CT cervical spine wo contrast 09/24/2021: No acute fracture or traumatic subluxation of the cervical spine. Straightening of normal lordosis may be due to positioning or muscle spasm. Orogastric tube is looped in the pharynx, but extends into the ?visualized esophagus. ? ?PCCM consulted for admission due to cardiac arrest with hemodynamic instability requiring mechanical ventilatory support. ?Pertinent  Medical History  ?T2DM ?HTN ?Anxiety ?Current everyday smoker ? ?Significant Hospital Events: ?Including procedures, antibiotic start and stop dates in addition to other pertinent events   ?10/01/21: Admit to ICU post cardiac arrest on normothermia protocol, vasopressors requiring mechanical ventilation. No brainstem reflexes present, CTH showing cerebral edema. ? ?Interim History / Subjective:  ?RASS: -5 without any sedation, no brainstem reflexes, pupils: #6 non reactive. ?Sister bedside discussed current plan of care and high risk of cardiac arrest & brain damage, overall grave prognosis. ?All questions and concerns answered at this time. ? ?Objective   ?Blood pressure (!) 154/73, pulse 100, temperature (!) 95.3 ?F (35.2 ?C), resp. rate (!) 24, SpO2 96 %. ?   ?Vent Mode: AC ?FiO2 (%):  [40 %] 40 % ?Set Rate:  [18 bmp] 18 bmp ?Vt Set:  [500 mL] 500 mL ?PEEP:  [5 cmH20] 5 cmH20  ?No intake or output data in the 24 hours ending 10/01/21 0010 ?There were no  vitals filed for this visit. ? ?Examination: ?General: Adult male, critically ill, lying in bed intubated & sedated requiring mechanical ventilation, NAD ?HEENT: MM pink/moist, anicteric,  atraumatic, neck supple ?Neuro: RASS: -5, unable to follow commands, Pupils +6 fixed and non reactive, no cough/gag/corneal reflex ?CV: s1s2 RRR, NSR on monitor, no r/m/g ?Pulm: Regular, non labored on PRVC 40%/PEEP 5, breath sounds diminished throughout ?GI: soft, rounded, bs x 4 ?GU: foley in place with clear yellow urine ?Skin: scattered scabbed abrasions ?Extremities: warm/dry, pulses + 2 R/P, trace edema noted BLE ? ?Resolved Hospital Problem list   ? ? ?Assessment & Plan:  ?Cardiac arrest ?Circulatory shock ?PMHx: Current everyday smoker, HTN ?Est 40-50 minutes downtime, initial rhythm PEA > VF received 2 defibrillations, suspected anoxic injury ? UDS: + Marijuana ?- candidate for Normothermia Protocol  ?- Continue vasopressors: levophed & epinephrine PRN, to maintain MAP > 65 ?- Echocardiogram ordered ?- Trend troponin, lactic ?- Cardiology consulted, appreciate input ?- continuous cardiac monitoring ? ?Acute hypoxic respiratory failure in the setting of cardiac arrest with prolonged downtime and concern for anoxic injury ? PMHx: current everyday smoker, OSA? (Per sister, does not wear O2 at home) ?- Ventilator settings: PRVC 8 mL/kg, 40% FiO2, 5 PEEP ?- Wean PEEP and FiO2 for sats greater than 90% ?- Plateau pressures less than 30 cm H20 ?- VAP bundle in place ?- Intermittent chest x-ray & ABG ?- Daily WUA/ SBT as tolerated ?- Ensure adequate pulmonary hygiene  ?- F/u cultures, trend PCT ? ?Suspected UTI ?- Continue ceftriaxone IV ?- f/u cultures ?- daily CBC, trend WBC/fever curve ? ?At risk for anoxic encephalopathy. ?40-50 minutes downtime. Initial head CT: demonstrating cerebral edema ?[facility]'s [facility]/ lack of brainstem reflexes and Na: 131. In agreement to attempt to increase patient's Na+ to goal of 150 with NS infusion and bolus of 3% NaCl to help prevent herniation. ?- 100 mL bolus of 3 %, f/u Na+ Q 6 > goal 150 ?- RASS goal: 0. -1 ?- Assess  EEG  ?- consider repeat CT head/MRI in 24-48 hours ?- Neuro consult if needed  ?- Avoid sedating medications as able ?- PAD: fentanyl IV & versed IV PRN ? ?Mild to moderate Diabetic ketoacidosis in the setting of cardiac arrest ?PMHx: T2DM ?- DKA protocol initiated ?- sodium bicarbonate supplementation ?- NS 20 mL/kg bolus followed by protocol IV hydration, when blood glucose falls below 250 add D5 to IV fluids ?- Insulin drip ordered per Endo tool protocol ?- Strict I/O's: alert provider if UOP < 0.5 mL/kg/hr ?- Q 4 BMP, closely monitor potassium, replace electrolytes PRN ?- monitor blood glucose every 1 hour, per Endo tool protocol ?- trend serum CO2/ AG/ Lactic, f/u A1C ?- Resume home insulin regiment once appropriate ? ?Mild Transaminitis secondary to suspected shock liver in the setting of cardiac arrest ?- Trend hepatic function ?- Consider RUQ US/CT abdomen/pelvis as needed ?- avoid hepatotoxic agents ? ?Best Practice (right click and "Reselect all SmartList Selections" daily)  ?Diet/type: NPO w/ meds via tube ?DVT prophylaxis: SCD * patient received TNKase, will hold off on CVC placement as long as possible ?GI prophylaxis: PPI ?Lines: N/A ?Foley:  Yes, and it is still needed ?Code Status:  full code ?Last date of multidisciplinary goals of care discussion [09/16/2021] ? ?Labs   ?CBC: ?Recent Labs  ?Lab 09/19/2021 ?2235 09/08/2021 ?2309  ?WBC 10.8* 12.9*  ?NEUTROABS  --  5.1  ?HGB 13.0 13.0  ?HCT  42.9 41.6  ?MCV 105.1* 103.7*  ?PLT 281 293  ? ? ?Basic Metabolic Panel: ?No results for input(s): NA, K, CL, CO2, GLUCOSE, BUN, CREATININE, CALCIUM, MG, PHOS in the last 168 hours. ?GFR: ?CrCl cannot be calculated (Patient's most recent lab result is older than the maximum 21 days allowed.). ?Recent Labs  ?Lab 09/09/2021 ?2235 09/23/2021 ?2309  ?WBC 10.8* 12.9*  ? ? ?Liver Function Tests: ?No results for input(s): AST, ALT, ALKPHOS, BILITOT, PROT, ALBUMIN in the last 168 hours. ?No results for input(s): LIPASE, AMYLASE in the  last 168 hours. ?No results for input(s): AMMONIA in the last 168 hours. ? ?ABG ?No results found for: PHART, PCO2ART, PO2ART, HCO3, TCO2, ACIDBASEDEF, O2SAT  ? ?Coagulation Profile: ?Recent Labs  ?Lab

## 2021-10-01 NOTE — Progress Notes (Signed)
CDS notified of GCS <5, referral # [phone] ? ?

## 2021-10-01 NOTE — Progress Notes (Signed)
Chaplain met with sister and pt. as a follow-up referral.  ? ?Chaplain encouraged sister to notify nurse, if Chaplain is required.  ? ? ?

## 2021-10-01 NOTE — Progress Notes (Signed)
[facility] Cardiology ? ?SUBJECTIVE: Patient intubated, unresponsive ? ? ?Vitals:  ? 10/01/21 0645 10/01/21 0700 10/01/21 0800 10/01/21 0801  ?BP: 99/69 99/71 111/75 111/75  ?Pulse: 95 95 93 93  ?Resp: (!) 22 (!) 22 19 (!) 24  ?Temp: 98.2 ?F (36.8 ?C) 98.2 ?F (36.8 ?C) 98.1 ?F (36.7 ?C) 98.1 ?F (36.7 ?C)  ?TempSrc:      ?SpO2: 100% 100%  100%  ?Weight:      ?Height:      ? ? ? ?Intake/Output Summary (Last 24 hours) at 10/01/2021 0901 ?Last data filed at 10/01/2021 0700 ?Gross per 24 hour  ?Intake 2985.92 ml  ?Output 1615 ml  ?Net 1370.92 ml  ? ? ? ? ?PHYSICAL EXAM ? ?General: Intubated ?HEENT:  Normocephalic and atramatic ?Neck:  No JVD.  ?Lungs: Clear bilaterally to auscultation and percussion. ?Heart: HRRR . Normal S1 and S2 without gallops or murmurs.  ?Abdomen: Bowel sounds are positive, abdomen soft and non-tender  ?Msk:  Back normal, normal gait. Normal strength and tone for age. ?Extremities: No clubbing, cyanosis or edema.   ?Neuro: Unresponsive, pupils fixed and dilated ?Psych: Unresponsive ? ? ?LABS: ?Basic Metabolic Panel: ?Recent Labs  ?  2021/10/14 ?2309 2021/10/14 ?2330 10/01/21 ?0345 10/01/21 ?1610  ?NA  --    < > 135 139  ?K  --    < > 7.2* 3.0*  ?CL  --    < > 106 103  ?CO2  --    < > 16* 27  ?GLUCOSE  --    < > 313* 255*  ?BUN  --    < > 12 12  ?CREATININE  --    < > 1.04 1.26*  ?CALCIUM  --    < > 7.8* 8.2*  ?MG 3.1*  --   --  1.7  ?PHOS 11.0*  --   --  1.3*  ? < > = values in this interval not displayed.  ? ?Liver Function Tests: ?Recent Labs  ?  Oct 14, 2021 ?2330 10/01/21 ?9604  ?AST 88* 231*  ?ALT 54* 102*  ?ALKPHOS 99 106  ?BILITOT 0.5 0.5  ?PROT 6.2* 5.6*  ?ALBUMIN 3.0* 2.6*  ? ?No results for input(s): LIPASE, AMYLASE in the last 72 hours. ?CBC: ?Recent Labs  ?  14-Oct-2021 ?2235 10-14-21 ?2309  ?WBC 10.8* 12.9*  ?NEUTROABS  --  5.1  ?HGB 13.0 13.0  ?HCT 42.9 41.6  ?MCV 105.1* 103.7*  ?PLT 281 293  ? ?Cardiac Enzymes: ?No results for input(s): CKTOTAL, CKMB, CKMBINDEX, TROPONINI in the last 72  hours. ?BNP: ?Invalid input(s): POCBNP ?D-Dimer: ?No results for input(s): DDIMER in the last 72 hours. ?Hemoglobin A1C: ?Recent Labs  ?  10/01/21 ?0345  ?HGBA1C 9.6*  ? ?Fasting Lipid Panel: ?No results for input(s): CHOL, HDL, LDLCALC, TRIG, CHOLHDL, LDLDIRECT in the last 72 hours. ?Thyroid Function Tests: ?No results for input(s): TSH, T4TOTAL, T3FREE, THYROIDAB in the last 72 hours. ? ?Invalid input(s): FREET3 ?Anemia Panel: ?No results for input(s): VITAMINB12, FOLATE, FERRITIN, TIBC, IRON, RETICCTPCT in the last 72 hours. ? ?DG Abd 1 View ? ?Result Date: 10/01/2021 ?CLINICAL DATA:  OG tube placement. EXAM: ABDOMEN - 1 VIEW COMPARISON:  Radiograph from Oct 14, 2021. FINDINGS: Bowel gas pattern not well evaluated. 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 EKG leads project over the patient's chest and abdomen. Pacer pads also project over the upper abdomen. On limited assessment there is no acute skeletal process. IMPRESSION: 1. Feeding tube in the stomach, tip in the distal stomach with side port below GE junction. 2. Signs of situs ambiguous. Electronically Signed   By: Donzetta KohutGeoffrey  Wile M.D.   On: 10/01/2021 08:14  ? ?CT HEAD WO CONTRAST (5MM) ? ?Result Date: 09/19/2021 ?CLINICAL DATA:  Sudden onset of severe headache.  Post CPR. EXAM: CT HEAD WITHOUT CONTRAST TECHNIQUE: Contiguous axial images were obtained from the base of the skull through the vertex without intravenous contrast. RADIATION DOSE REDUCTION: This exam was performed according to the departmental dose-optimization program which includes automated exposure control, adjustment of the mA and/or kV according to patient size and/or use of iterative reconstruction technique. COMPARISON:  Head CT 04/05/2019 FINDINGS:  Brain: There is diffuse sulcal effacement and loss of gray-white differentiation throughout the cerebral hemispheres. Pseudo subarachnoid hemorrhage with increased density of the subarachnoid space typical of cerebral edema. No hemorrhage or acute infarct. No subdural collection. Lateral ventricles are smaller than on prior CT. The basilar cisterns remain patent. Vascular: No focal hyperdense vessel. Skull: No fracture or focal lesion. Sinuses/Orbits: Partial opacification of left mastoid air cells. Mucosal thickening throughout the ethmoid air cells and sphenoid sinuses. Other: Left frontal scalp scarring. IMPRESSION: Diffuse sulcal effacement and loss of gray-white differentiation throughout the cerebral hemispheres, highly suspicious for cerebral edema. These results were called by telephone at the time of interpretation on 09/06/2021 at 11:32 pm to provider [facility] , who verbally acknowledged these results. Electronically Signed   By: Narda RutherfordMelanie  Sanford M.D.   On: 09/24/2021 23:34  ? ?CT Cervical Spine Wo Contrast ? ?Result Date: 09/11/2021 ?CLINICAL DATA:  Ataxia, cervical trauma Post CPR. EXAM: CT CERVICAL SPINE WITHOUT CONTRAST TECHNIQUE: Multidetector CT imaging of the cervical spine was performed without intravenous contrast. Multiplanar CT image reconstructions were also generated. RADIATION DOSE REDUCTION: This exam was performed according to the departmental dose-optimization program which includes automated exposure control, adjustment of the mA and/or kV according to patient size and/or use of iterative reconstruction technique. COMPARISON:  04/05/2019 FINDINGS: Alignment: Straightening of normal lordosis. No traumatic subluxation. Skull base and vertebrae: No acute fracture. No primary bone lesion or focal pathologic process. Soft tissues and spinal canal: No prevertebral fluid or swelling. No visible canal hematoma. Disc levels: Mild anterior spurring at multiple levels. C5-C6 disc space narrowing.  Upper chest: Enteric tube in place. Orogastric tube is looped in the pharynx. No acute apical lung findings. Other: None. IMPRESSION: 1. No acute fracture or traumatic subluxation of the cervical spine. 2. Straightening of normal lordosis may be due to positioning or muscle spasm. 3. Orogastric tube is looped in the pharynx, but extends into the visualized esophagus. Electronically Signed   By: Narda RutherfordMelanie  Sanford M.D.   On: 09/29/2021 23:37  ? ?DG Chest Portable 1 View ? ?Result Date: 09/23/2021 ?CLINICAL DATA:  Intubated EXAM: PORTABLE CHEST 1 VIEW COMPARISON:  CT chest dated 04/06/2019 FINDINGS: Endotracheal tube terminates 3 cm above the carina. Low lung volumes. No frank interstitial edema. Mild bibasilar atelectasis. No pleural effusion or pneumothorax. Heart is normal in size. Defibrillator pads overlying the left hemithorax. IMPRESSION: Endotracheal tube terminates 3 cm above the carina. Electronically Signed   By: Charline BillsSriyesh  Krishnan M.D.   On: 09/12/2021 23:52  ? ?DG Abd Portable 1 View ? ?Result Date: 09/12/2021 ?CLINICAL DATA:  Intubated EXAM: PORTABLE ABDOMEN -  1 VIEW COMPARISON:  None. FINDINGS: Paucity of bowel gas. Defibrillator pads overlying the left upper abdomen. IMPRESSION: Negative. Electronically Signed   By: Charline Bills M.D.   On: 10/02/2021 23:53   ? ? ?Echo pending ? ?TELEMETRY: Sinus rhythm: ? ?ASSESSMENT AND PLAN: ? ?Principal Problem: ?  Cardiac arrest (HCC) ?  ? ?1.  Cardiopulmonary arrest, initial agonal breathing with PEA, treated with epinephrine, developed VF requiring defibrillation x 2 , downtime approximately 40 minutes, ECG nondiagnostic, patient given thrombolytics during CPR, cardiac catheterization deferred in the absence of diagnostic ECG changes, especially right after receiving thrombolytics, in patient with current anoxic encephalopathy, head CT revealing cerebral edema, patient currently intubated, in sinus rhythm, hemodynamically stable on vasopressor ?2.  Anoxic  encephalopathy, 40-minute downtime, pupils fixed and dilated, head CT revealing cerebral edema, patient currently unresponsive ?3.  Acute hypoxic respiratory failure following neopulmonary arrest, intubated ?4.  Diabetic ketoacidos

## 2021-10-01 NOTE — Progress Notes (Signed)
MEDICATION RELATED CONSULT NOTE - INITIAL  ? ?Pharmacy Consult for 3% NaCl monitoring  ?Indication: cerebral edema / impending cranial herniation  ? ?Allergies  ?Allergen Reactions  ? Penicillins Rash  ?  Pt thinks he's allergic but not positive;   ? ? ?Patient Measurements: ?  ?Adjusted Body Weight:  ? ?Vital Signs: ?Temp: 95.5 ?F (35.3 ?C) (04/29 0030) ?BP: 118/60 (04/29 0030) ?Pulse Rate: 95 (04/29 0030) ?Intake/Output from previous day: ?04/28 0701 - 04/29 0700 ?In: -  ?Out: 650 [Urine:650] ?Intake/Output from this shift: ?Total I/O ?In: -  ?Out: 650 [Urine:650] ? ?Labs: ?Recent Labs  ?  09/21/2021 ?2235 09/25/2021 ?2309 09/24/2021 ?2330  ?WBC 10.8* 12.9*  --   ?HGB 13.0 13.0  --   ?HCT 42.9 41.6  --   ?PLT 281 293  --   ?APTT 48* 41*  --   ?CREATININE  --   --  1.10  ?ALBUMIN  --   --  3.0*  ?PROT  --   --  6.2*  ?AST  --   --  88*  ?ALT  --   --  54*  ?ALKPHOS  --   --  99  ?BILITOT  --   --  0.5  ? ?CrCl cannot be calculated (Unknown ideal weight.). ? ? ?Microbiology: ?Recent Results (from the past 720 hour(s))  ?Resp Panel by RT-PCR (Flu A&B, Covid) Nasopharyngeal Swab     Status: None  ? Collection Time: 10/01/21 12:28 AM  ? Specimen: Nasopharyngeal Swab; Nasopharyngeal(NP) swabs in vial transport medium  ?Result Value Ref Range Status  ? SARS Coronavirus 2 by RT PCR NEGATIVE NEGATIVE Final  ?  Comment: (NOTE) ?SARS-CoV-2 target nucleic acids are NOT DETECTED. ? ?The SARS-CoV-2 RNA is generally detectable in upper respiratory ?specimens during the acute phase of infection. The lowest ?concentration of SARS-CoV-2 viral copies this assay can detect is ?138 copies/mL. A negative result does not preclude SARS-Cov-2 ?infection and should not be used as the sole basis for treatment or ?other patient management decisions. A negative result may occur with  ?improper specimen collection/handling, submission of specimen other ?than nasopharyngeal swab, presence of viral mutation(s) within the ?areas targeted by this assay,  and inadequate number of viral ?copies(<138 copies/mL). A negative result must be combined with ?clinical observations, patient history, and epidemiological ?information. The expected result is Negative. ? ?Fact Sheet for Patients:  ?EntrepreneurPulse.com.au ? ?Fact Sheet for Healthcare Providers:  ?IncredibleEmployment.be ? ?This test is no t yet approved or cleared by the [place] FDA and  ?has been authorized for detection and/or diagnosis of SARS-CoV-2 by ?FDA under an Emergency Use Authorization (EUA). This EUA will remain  ?in effect (meaning this test can be used) for the duration of the ?COVID-19 declaration under Section 564(b)(1) of the Act, 21 ?U.S.C.section 360bbb-3(b)(1), unless the authorization is terminated  ?or revoked sooner.  ? ? ?  ? Influenza A by PCR NEGATIVE NEGATIVE Final  ? Influenza B by PCR NEGATIVE NEGATIVE Final  ?  Comment: (NOTE) ?The Xpert Xpress SARS-CoV-2/FLU/RSV plus assay is intended as an aid ?in the diagnosis of influenza from Nasopharyngeal swab specimens and ?should not be used as a sole basis for treatment. Nasal washings and ?aspirates are unacceptable for Xpert Xpress SARS-CoV-2/FLU/RSV ?testing. ? ?Fact Sheet for Patients: ?EntrepreneurPulse.com.au ? ?Fact Sheet for Healthcare Providers: ?IncredibleEmployment.be ? ?This test is not yet approved or cleared by the [place] FDA and ?has been authorized for detection and/or diagnosis of SARS-CoV-2 by ?FDA under an  Emergency Use Authorization (EUA). This EUA will remain ?in effect (meaning this test can be used) for the duration of the ?COVID-19 declaration under Section 564(b)(1) of the Act, 21 U.S.C. ?section 360bbb-3(b)(1), unless the authorization is terminated or ?revoked. ? ?Performed at [facility], [place], ?[place] [address] ?  ? ? ?Medical History: ?Past Medical History:  ?Diagnosis Date  ? Arthritis   ? Diabetes  ([place])   ? Hypertension   ? ? ?Medications:  ? ? ?Assessment: ?Pharmacy consulted to monitor Na in this 46 year old male S/P cardiac arrest with successful resuscitation, now with hyponatremia / cerebral edema. ? ?4/28:  Na @ 2330 = 131  ? ?Goal of Therapy:  ?Na WNL , resolution of cerebral edema ? ?Plan:  ?3% NaCl 100 mL bolus IV X 1 @ 200 ml/hr (30 min) ordered on 4/29 @ 0243.  ?- continuous infusion not currently ordered.   ?- Will recheck Na on 4/29 @ 0400.  ? ?Jeania Nater D ?10/01/2021,3:14 AM ? ? ? ?

## 2021-10-01 NOTE — Progress Notes (Signed)
*  PRELIMINARY RESULTS* ?Echocardiogram ?2D Echocardiogram has been performed. ? ?Lenor Coffin ?10/01/2021, 11:58 AM ?

## 2021-10-01 NOTE — Progress Notes (Signed)
Inpatient Diabetes Program Recommendations ? ?AACE/ADA: New Consensus Statement on Inpatient Glycemic Control  ? ?Target Ranges:  Prepandial:   less than 140 mg/dL ?     Peak postprandial:   less than 180 mg/dL (1-2 hours) ?     Critically ill patients:  140 - 180 mg/dL  ? ? Latest Reference Range & Units 10/01/21 05:17 10/01/21 06:37 10/01/21 07:32 10/01/21 08:41  ?Glucose-Capillary 70 - 99 mg/dL 338 (H) 250 (H) 539 (H) 170 (H)  ? ? Latest Reference Range & Units 10/01/21 05:29  ?CO2 22 - 32 mmol/L 27  ?Glucose 70 - 99 mg/dL 767 (H)  ?Anion gap 5 - 15  9  ? ? Latest Reference Range & Units 10-12-2021 23:30  ?CO2 22 - 32 mmol/L 18 (L)  ?Glucose 70 - 99 mg/dL 341 (HH)  ?Anion gap 5 - 15  22 (H)  ? ?Review of Glycemic Control ? ?Diabetes history: DM2 ?Outpatient Diabetes medications: None listed on home med list; per PCP note on 03/14/21, patient was prescribed Metformin XR 500 mg daily; had been prescribed Lantus 20 units QHS but was not taking due to not wanting to be on injections ?Current orders for Inpatient glycemic control: IV insulin ? ?Inpatient Diabetes Program Recommendations:   ? ?Insulin: Once provider is ready to transition from IV to SQ insulin, please consider ordering Levemir 15 units Q24H (based on 101.5 kg x 0.15 units), CBGs Q4H, Novolog 0-9 units Q4H. ? ?NOTE: Noted consult for Diabetes Coordinator. Diabetes Coordinator is not on campus over the weekend but available by pager from 8am to 5pm for questions or concerns. Chart reviewed. In reviewing chart, noted patient seen Mychal Emilio Aspen, PA with [facility] on 03/14/21 and per note patient was not taking any DM medication outpatient for DM, "Was on metformin in the past - didn't feel great on it, but doesn't recall specific side effects, so is willing to give it a try again today. Will restart metformin XR at 500 mg daily. Need to follow-up in 1 month to discuss labs. Talked about adding Trulicity in the future, though patient does  not want to do injections, which is why he stopped his Lantus."  Per H&P, patient presented to "[facility] ED on 10-12-2021 with CPR in progress after becoming unresponsive at a neighbor's house.EMS reported upon their arrival he had pulses and was agonally breathing. Patient deteriorated into PEA, 2 episodes of V-fib with 2 defibrillations, he received 300 mg of amio. With ROSC, EKG was concerning for a STEMI. Pulses were lost again en route." Admitted with cardiac arrest, respiratory failure in setting of cardiac arrest with prolonged downtime and concern for anoxic brain injury, and DKA. Initial glucose 559 mg/dl, CO2 18, AG 22. Patient was started on IV insulin and is still currently on IV insulin.  ? ?Thanks ?Orlando Penner, RN, MSN, CDE ?Diabetes Coordinator ?Inpatient Diabetes Program ?[phone] (Team Pager from 8am to 5pm) ? ? ? ? ?

## 2021-10-01 NOTE — IPAL (Signed)
?  Interdisciplinary Goals of Care Family Meeting ? ? ?Date carried out: 10/01/2021 ? ?Location of the meeting: Bedside ? ?Member's involved: Physician and Family Member or next of kin ? ? ? ? ?Code status: Full Code ? ?Disposition: Continue current acute care ? ? ? ? ? ? ?GOALS OF CARE DISCUSSION ? ?The Clinical status was relayed to family in detail- ? ?Updated and notified of patients medical condition- ?Patient remains unresponsive and will not open eyes to command.   ?Patient is having a weak cough and struggling to remove secretions.   ?Patient with increased WOB and using accessory muscles to breathe ?Explained to family course of therapy and the modalities  ? ?Patient with Progressive multiorgan failure with a very high probablity of a very minimal chance of meaningful recovery despite all aggressive and optimal medical therapy.  ?PATIENT REMAINS FULL CODE ? ?Family understands the situation. ?PATIENT SHOWING SIGNS OF BRAIN DAMAGE AND BRAIN DEATH ?AWAITING MRI AND NEUROLOGY RECS ? ? ?Family are satisfied with Plan of action and management. All questions answered ? ?Additional CC time 25 mins ? ? ?Lucie Leather, M.D.  ?[facility]  ?Medical Director [facility] [place] ?Medical Director [facility] Cardio-Pulmonary Department  ? ? ?

## 2021-10-01 NOTE — Progress Notes (Signed)
?  10/01/21 0000  ?Clinical Encounter Type  ?Visited With Patient and family together  ?Visit Type Initial  ?Referral From Nurse  ?Consult/Referral To Chaplain  ? ?Chaplain responded to CPR in progress in ED. Chaplain met with family while patient was being assessed. Chaplain provided compassionate presence and reflective listening as sister Anne Ng and niece Jocelyn Lamer and friend spoke about their loved one [place]. Chaplain stayed with family throughout evening to provide support and care. Patient being transferred to ICU. ?

## 2021-10-01 NOTE — Procedures (Signed)
Central Venous Catheter Insertion Procedure Note ? ?Jose Winters  ?[id number]  ?20-May-1976 ? ?Date:10/01/21  ?Time:5:42 AM  ? ?Provider Performing:[name]  ? ?Procedure: Insertion of Non-tunneled Central Venous Catheter(36556) with US guidance (84665)  ? ?Indication(s) ?Medication administration ? ?Consent ?Risks of the procedure as well as the alternatives and risks of each were explained to the patient and/or caregiver.  Consent for the procedure was obtained and is signed in the bedside chart ? ?Anesthesia ?Topical only with 1% lidocaine  ? ?Timeout ?Verified patient identification, verified procedure, site/side was marked, verified correct patient position, special equipment/implants available, medications/allergies/relevant history reviewed, required imaging and test results available. ? ?Sterile Technique ?Maximal sterile technique including full sterile barrier drape, hand hygiene, sterile gown, sterile gloves, mask, hair covering, sterile ultrasound probe cover (if used). ? ?Procedure Description ?Area of catheter insertion was cleaned with chlorhexidine and draped in sterile fashion.  With real-time ultrasound guidance a central venous catheter was placed into the right femoral vein. Nonpulsatile blood flow and easy flushing noted in all ports.  The catheter was sutured in place and sterile dressing applied. ? ?Complications/Tolerance ?None; patient tolerated the procedure well. ?Chest X-ray is ordered to verify placement for internal jugular or subclavian cannulation.   Chest x-ray is not ordered for femoral cannulation. ? ?EBL ?Minimal ? ?Specimen(s) ?None ? ?Jose Winters, AGACNP-BC ?Acute Care Nurse Practitioner ?[facility]  ? ?[phone] / [phone] ?Please see Amion for pager details.  ? ?

## 2021-10-01 NOTE — Progress Notes (Signed)
MEDICATION RELATED CONSULT NOTE - INITIAL  ? ?Pharmacy Consult for 3% NaCl monitoring  ?Indication: cerebral edema / impending cranial herniation  ? ?Allergies  ?Allergen Reactions  ? Penicillins Rash  ?  Pt thinks he's allergic but not positive;   ? ? ?Patient Measurements: ?Height: 5\' 9"  (175.3 cm) ?Weight: 101.5 kg (223 lb 12.3 oz) ?IBW/kg (Calculated) : 70.7 ?Adjusted Body Weight:  ? ?Vital Signs: ?Temp: 98.2 ?F (36.8 ?C) (04/29 0615) ?Temp Source: Bladder (04/29 0330) ?BP: 99/69 (04/29 0615) ?Pulse Rate: 97 (04/29 0615) ?Intake/Output from previous day: ?04/28 0701 - 04/29 0700 ?In: 2802.4 [I.V.:713.6; IV Piggyback:2088.8] ?Out: 1615 [Urine:1615] ?Intake/Output from this shift: ?Total I/O ?In: 2802.4 [I.V.:713.6; IV Piggyback:2088.8] ?Out: 1615 [Urine:1615] ? ?Labs: ?Recent Labs  ?  09/05/2021 ?2235 09/22/2021 ?2309 09/24/2021 ?2330 10/01/21 ?0345 10/01/21 ?10/16/2021  ?WBC 10.8* 12.9*  --   --   --   ?HGB 13.0 13.0  --   --   --   ?HCT 42.9 41.6  --   --   --   ?PLT 281 293  --   --   --   ?APTT 48* 41*  --   --   --   ?CREATININE  --   --  1.10 1.04 1.26*  ?MG  --  3.1*  --   --   --   ?PHOS  --  11.0*  --   --   --   ?ALBUMIN  --   --  3.0*  --   --   ?PROT  --   --  6.2*  --   --   ?AST  --   --  88*  --   --   ?ALT  --   --  54*  --   --   ?ALKPHOS  --   --  99  --   --   ?BILITOT  --   --  0.5  --   --   ? ? ?Estimated Creatinine Clearance: 86.9 mL/min (A) (by C-G formula based on SCr of 1.26 mg/dL (H)). ? ? ?Microbiology: ?Recent Results (from the past 720 hour(s))  ?Resp Panel by RT-PCR (Flu A&B, Covid) Nasopharyngeal Swab     Status: None  ? Collection Time: 10/01/21 12:28 AM  ? Specimen: Nasopharyngeal Swab; Nasopharyngeal(NP) swabs in vial transport medium  ?Result Value Ref Range Status  ? SARS Coronavirus 2 by RT PCR NEGATIVE NEGATIVE Final  ?  Comment: (NOTE) ?SARS-CoV-2 target nucleic acids are NOT DETECTED. ? ?The SARS-CoV-2 RNA is generally detectable in upper respiratory ?specimens during the acute phase  of infection. The lowest ?concentration of SARS-CoV-2 viral copies this assay can detect is ?138 copies/mL. A negative result does not preclude SARS-Cov-2 ?infection and should not be used as the sole basis for treatment or ?other patient management decisions. A negative result may occur with  ?improper specimen collection/handling, submission of specimen other ?than nasopharyngeal swab, presence of viral mutation(s) within the ?areas targeted by this assay, and inadequate number of viral ?copies(<138 copies/mL). A negative result must be combined with ?clinical observations, patient history, and epidemiological ?information. The expected result is Negative. ? ?Fact Sheet for Patients:  ?10/22/2021 ? ?Fact Sheet for Healthcare Providers:  ?BloggerCourse.com ? ?This test is no t yet approved or cleared by the [employer] FDA and  ?has been authorized for detection and/or diagnosis of SARS-CoV-2 by ?FDA under an Emergency Use Authorization (EUA). This EUA will remain  ?in effect (meaning this test can be used)  for the duration of the ?COVID-19 declaration under Section 564(b)(1) of the Act, 21 ?U.S.C.section 360bbb-3(b)(1), unless the authorization is terminated  ?or revoked sooner.  ? ? ?  ? Influenza A by PCR NEGATIVE NEGATIVE Final  ? Influenza B by PCR NEGATIVE NEGATIVE Final  ?  Comment: (NOTE) ?The Xpert Xpress SARS-CoV-2/FLU/RSV plus assay is intended as an aid ?in the diagnosis of influenza from Nasopharyngeal swab specimens and ?should not be used as a sole basis for treatment. Nasal washings and ?aspirates are unacceptable for Xpert Xpress SARS-CoV-2/FLU/RSV ?testing. ? ?Fact Sheet for Patients: ?BloggerCourse.com ? ?Fact Sheet for Healthcare Providers: ?[employer] ? ?This test is not yet approved or cleared by the [place] FDA and ?has been authorized for detection and/or diagnosis of  SARS-CoV-2 by ?FDA under an Emergency Use Authorization (EUA). This EUA will remain ?in effect (meaning this test can be used) for the duration of the ?COVID-19 declaration under Section 564(b)(1) of the Act, 21 U.S.C. ?section 360bbb-3(b)(1), unless the authorization is terminated or ?revoked. ? ?Performed at [facility], [address] [place], ?[place] [address] ?  ? ? ?Medical History: ?Past Medical History:  ?Diagnosis Date  ? Arthritis   ? Diabetes (HCC)   ? Hypertension   ? ? ?Medications:  ? ? ?Assessment: ?Pharmacy consulted to monitor Na in this 46 year old male S/P cardiac arrest with successful resuscitation, now with hyponatremia / cerebral edema. ? ?4/28:  Na @ 2330 = 131  ?3% NaCl 100 mL given over 30 min @ 0243 ?4/29: Na @ 0345 = 135 (suspected this result to be in error to repeat)  ?         Na @ 0529 = 139 (NP takes this value to be valid)  ? ?Goal of Therapy:  ?Na > 150 mEq/L , resolution of cerebral edema ? ?Plan:  ?3% NaCl 100 mL bolus IV X 1 @ 200 ml/hr (30 min) ordered on 4/29 @ 0243.  ?- continuous infusion not currently ordered.   ?- Will recheck Na on 4/29 @ 0400.  ? ?4/29:  Will start 3% NaCl infusion @ 75 ml/hr to achieve Na > 150 mEq/L. ?- Will check Na Q6H ? ?Jerrold Haskell D ?10/01/2021,6:30 AM ? ? ? ?

## 2021-10-01 NOTE — Progress Notes (Signed)
eLink Physician-Brief Progress Note ?Patient Name: Jose Winters ?DOB: 07-18-1975 ?MRN: [id number] ? ? ?Date of Service ? 10/01/2021  ?HPI/Events of Note ? 46 yr old male s/p OOHA with prolonged resuscitation effort before consistent ROSC.  Evaluated by cardiology.  Received thrombolytics in the ED.  Hd CT with significant cerebral edema.  Case discussed with NP Rust-[place] agree with plan.  ?eICU Interventions ? Continue to support with mechanical ventilation and vasopressors.  Will use hypertonic saline to raise sodium in hopes of improving cerebral edema.  Very concerned patient has experienced severe anoxic event.  ? ? ? ?Intervention Category ?Evaluation Type: New Patient Evaluation ? ?Henry Russel, P ?10/01/2021, 2:09 AM ?

## 2021-10-02 ENCOUNTER — Inpatient Hospital Stay: Payer: Self-pay

## 2021-10-02 DIAGNOSIS — E872 Acidosis, unspecified: Secondary | ICD-10-CM

## 2021-10-02 DIAGNOSIS — Z515 Encounter for palliative care: Secondary | ICD-10-CM

## 2021-10-02 DIAGNOSIS — G939 Disorder of brain, unspecified: Secondary | ICD-10-CM

## 2021-10-02 DIAGNOSIS — R578 Other shock: Secondary | ICD-10-CM

## 2021-10-02 LAB — CBC
HCT: 40.2 % (ref 39.0–52.0)
HCT: 40.3 % (ref 39.0–52.0)
Hemoglobin: 12.7 g/dL — ABNORMAL LOW (ref 13.0–17.0)
Hemoglobin: 12.7 g/dL — ABNORMAL LOW (ref 13.0–17.0)
MCH: 32.2 pg (ref 26.0–34.0)
MCH: 32.1 pg (ref 26.0–34.0)
MCHC: 31.6 g/dL (ref 30.0–36.0)
MCHC: 31.5 g/dL (ref 30.0–36.0)
MCV: 101.8 fL — ABNORMAL HIGH (ref 80.0–100.0)
MCV: 101.8 fL — ABNORMAL HIGH (ref 80.0–100.0)
Platelets: 207 10*3/uL (ref 150–400)
Platelets: 194 10*3/uL (ref 150–400)
RBC: 3.95 MIL/uL — ABNORMAL LOW (ref 4.22–5.81)
RBC: 3.96 MIL/uL — ABNORMAL LOW (ref 4.22–5.81)
RDW: 12.5 % (ref 11.5–15.5)
RDW: 12.8 % (ref 11.5–15.5)
WBC: 11.4 10*3/uL — ABNORMAL HIGH (ref 4.0–10.5)
WBC: 9.8 10*3/uL (ref 4.0–10.5)
nRBC: 0 % (ref 0.0–0.2)
nRBC: 0 % (ref 0.0–0.2)

## 2021-10-02 LAB — BLOOD GAS, ARTERIAL
Acid-base deficit: 7 mmol/L — ABNORMAL HIGH (ref 0.0–2.0)
Acid-base deficit: 2.7 mmol/L — ABNORMAL HIGH (ref 0.0–2.0)
Bicarbonate: 20.2 mmol/L (ref 20.0–28.0)
Bicarbonate: 22.6 mmol/L (ref 20.0–28.0)
FIO2: 100 %
FIO2: 100 %
MECHVT: 500 mL
MECHVT: 500 mL
Mechanical Rate: 30
Mechanical Rate: 30
O2 Saturation: 96.8 %
O2 Saturation: 99.3 %
PEEP: 5 cmH2O
PEEP: 5 cmH2O
Patient temperature: 37
Patient temperature: 37
pCO2 arterial: 46 mmHg (ref 32–48)
pCO2 arterial: 40 mmHg (ref 32–48)
pH, Arterial: 7.25 — ABNORMAL LOW (ref 7.35–7.45)
pH, Arterial: 7.36 (ref 7.35–7.45)
pO2, Arterial: 81 mmHg — ABNORMAL LOW (ref 83–108)
pO2, Arterial: 106 mmHg (ref 83–108)

## 2021-10-02 LAB — URINE CULTURE: Culture: NO GROWTH

## 2021-10-02 LAB — BASIC METABOLIC PANEL
Anion gap: 6 (ref 5–15)
Anion gap: 12 (ref 5–15)
Anion gap: 9 (ref 5–15)
BUN: 23 mg/dL — ABNORMAL HIGH (ref 6–20)
BUN: 29 mg/dL — ABNORMAL HIGH (ref 6–20)
BUN: 30 mg/dL — ABNORMAL HIGH (ref 6–20)
CO2: 27 mmol/L (ref 22–32)
CO2: 21 mmol/L — ABNORMAL LOW (ref 22–32)
CO2: 22 mmol/L (ref 22–32)
Calcium: 7.6 mg/dL — ABNORMAL LOW (ref 8.9–10.3)
Calcium: 7.6 mg/dL — ABNORMAL LOW (ref 8.9–10.3)
Calcium: 7.8 mg/dL — ABNORMAL LOW (ref 8.9–10.3)
Chloride: 114 mmol/L — ABNORMAL HIGH (ref 98–111)
Chloride: 115 mmol/L — ABNORMAL HIGH (ref 98–111)
Chloride: 120 mmol/L — ABNORMAL HIGH (ref 98–111)
Creatinine, Ser: 2.22 mg/dL — ABNORMAL HIGH (ref 0.61–1.24)
Creatinine, Ser: 2.3 mg/dL — ABNORMAL HIGH (ref 0.61–1.24)
Creatinine, Ser: 2.03 mg/dL — ABNORMAL HIGH (ref 0.61–1.24)
GFR, Estimated: 36 mL/min — ABNORMAL LOW (ref >60)
GFR, Estimated: 35 mL/min — ABNORMAL LOW (ref >60)
GFR, Estimated: 40 mL/min — ABNORMAL LOW (ref >60)
Glucose, Bld: 164 mg/dL — ABNORMAL HIGH (ref 70–99)
Glucose, Bld: 289 mg/dL — ABNORMAL HIGH (ref 70–99)
Glucose, Bld: 319 mg/dL — ABNORMAL HIGH (ref 70–99)
Potassium: 4.3 mmol/L (ref 3.5–5.1)
Potassium: 4.7 mmol/L (ref 3.5–5.1)
Potassium: 4.7 mmol/L (ref 3.5–5.1)
Sodium: 147 mmol/L — ABNORMAL HIGH (ref 135–145)
Sodium: 148 mmol/L — ABNORMAL HIGH (ref 135–145)
Sodium: 151 mmol/L — ABNORMAL HIGH (ref 135–145)

## 2021-10-02 LAB — APTT: aPTT: 27 seconds (ref 24–36)

## 2021-10-02 LAB — HEPATIC FUNCTION PANEL
ALT: 79 U/L — ABNORMAL HIGH (ref 0–44)
AST: 56 U/L — ABNORMAL HIGH (ref 15–41)
Albumin: 2.3 g/dL — ABNORMAL LOW (ref 3.5–5.0)
Alkaline Phosphatase: 95 U/L (ref 38–126)
Bilirubin, Direct: 0.2 mg/dL (ref 0.0–0.2)
Indirect Bilirubin: 0.7 mg/dL (ref 0.3–0.9)
Total Bilirubin: 0.9 mg/dL (ref 0.3–1.2)
Total Protein: 5.6 g/dL — ABNORMAL LOW (ref 6.5–8.1)

## 2021-10-02 LAB — GLUCOSE, CAPILLARY
Glucose-Capillary: 171 mg/dL — ABNORMAL HIGH (ref 70–99)
Glucose-Capillary: 175 mg/dL — ABNORMAL HIGH (ref 70–99)
Glucose-Capillary: 202 mg/dL — ABNORMAL HIGH (ref 70–99)
Glucose-Capillary: 248 mg/dL — ABNORMAL HIGH (ref 70–99)
Glucose-Capillary: 256 mg/dL — ABNORMAL HIGH (ref 70–99)
Glucose-Capillary: 263 mg/dL — ABNORMAL HIGH (ref 70–99)

## 2021-10-02 LAB — TYPE AND SCREEN
ABO/RH(D): B POS
Antibody Screen: NEGATIVE

## 2021-10-02 LAB — PROTIME-INR
INR: 1.1 (ref 0.8–1.2)
Prothrombin Time: 13.9 seconds (ref 11.4–15.2)

## 2021-10-02 LAB — FIBRINOGEN: Fibrinogen: 542 mg/dL — ABNORMAL HIGH (ref 210–475)

## 2021-10-02 LAB — MAGNESIUM: Magnesium: 1.9 mg/dL (ref 1.7–2.4)

## 2021-10-02 LAB — PHOSPHORUS: Phosphorus: 6.4 mg/dL — ABNORMAL HIGH (ref 2.5–4.6)

## 2021-10-02 MED ORDER — LACTATED RINGERS IV BOLUS
500.0000 mL | Freq: Once | INTRAVENOUS | Status: AC
  Administered 2021-10-02: 500 mL via INTRAVENOUS

## 2021-10-02 MED ORDER — DEXTROSE 50 % IV SOLN
50.0000 mL | Freq: Once | INTRAVENOUS | Status: AC
  Administered 2021-10-02: 50 mL via INTRAVENOUS
  Filled 2021-10-02: qty 50

## 2021-10-02 MED ORDER — SODIUM CHLORIDE 0.9 % IV SOLN
1000.0000 mg | Freq: Once | INTRAVENOUS | Status: AC
  Administered 2021-10-02: 1000 mg via INTRAVENOUS
  Filled 2021-10-02: qty 16

## 2021-10-02 MED ORDER — INSULIN ASPART 100 UNIT/ML IJ SOLN
20.0000 [IU] | Freq: Once | INTRAMUSCULAR | Status: AC
  Administered 2021-10-02: 20 [IU] via SUBCUTANEOUS
  Filled 2021-10-02: qty 1

## 2021-10-02 MED ORDER — LEVOTHYROXINE SODIUM 100 MCG/5ML IV SOLN
20.0000 ug | Freq: Once | INTRAVENOUS | Status: AC
  Administered 2021-10-02: 20 ug via INTRAVENOUS
  Filled 2021-10-02: qty 5

## 2021-10-02 MED ORDER — ALBUMIN HUMAN 5 % IV SOLN
25.0000 g | Freq: Once | INTRAVENOUS | Status: AC
Start: 2021-10-02 — End: 2021-10-03
  Administered 2021-10-02: 25 g via INTRAVENOUS
  Filled 2021-10-02: qty 500

## 2021-10-02 MED ORDER — LACTATED RINGERS IV SOLN: INTRAVENOUS | Status: DC

## 2021-10-02 MED ORDER — SODIUM CHLORIDE 0.9 % IV SOLN
10.0000 ug/h | INTRAVENOUS | Status: DC
  Administered 2021-10-02: 20 ug/h via INTRAVENOUS
  Filled 2021-10-02 (×2): qty 10

## 2021-10-02 NOTE — H&P (Deleted)
? ?NAME:  Jose Winters, MRN:  [id number], DOB:  [birth year], LOS: 1 ?ADMISSION DATE:  09/12/2021, CONSULTATION DATE:  10/01/21 ?REFERRING MD:  Dr. Fuller Plan, CHIEF COMPLAINT:  Cardiac Arrest  ? ?History of Present Illness:  ?46 yo M presenting to [facility] ED on 09/04/2021 with CPR in progress after becoming unresponsive at a neighbor's house. Sister, Jose Winters, who arrived bedside stated she saw her brother earlier in the evening for "a couple of beers" at a local restaurant/bar. She reported he had no complaints, and was in his usual state of health. EMS reported upon their arrival he had pulses and was agonally breathing. Patient deteriorated into PEA, 2 episodes of V-fib with 2 defibrillations, he received 300 mg of amio. With ROSC, EKG was concerning for a STEMI. Pulses were lost again en route. ?ED course: ?Upon arrival ED continued ACLS measures until ROSC. Patient emergently intubated on mechanical ventilation. Concern for ischemia and CODE STEMI called. Patient received TNKase just prior to ROSC due to concern for STEMI. CTH negative for ICH but concerning for anoxic injury. Dr. Darrold Junker assessed the patient bedside, deciding he was not a candidate for cardiac cath at this time.  ?Medications given: TNKase, 1 L IVF, sodium bicarb, ACLS medications, epinephrine & levophed drips started ?Initial Vitals: 94.1, 21, 84, 100/56, 96% on BVM ?Significant labs: (Labs/ Imaging personally reviewed) ?I, Cheryll Cockayne Rust-Chester, AGACNP-BC, personally viewed and interpreted this ECG. ?EKG Interpretation: Date: 09/20/2021, EKG Time: 23:36, Rate: 101, Rhythm: NSR, QRS Axis:  normal, Intervals: normal, ST/T Wave abnormalities: non specific T wave inversion leads: I, II/ mild STE in I, II & mild ST depression in V5-V6, Narrative Interpretation: NSR with concern for ischemia ?Chemistry: Na+: 131, K+: 3.1, BUN/Cr.: 9/1.10, Serum CO2/ AG: 18/22, AST: 88, ALT: 54, Ca: 11.7, Phos:11, Mg: 3.1 ?Hematology: WBC: 12.9, Hgb: 13,  ?Troponin: 46 >279 ,  BNP: 61.4, Lactic/ PCT: >9/pending, BAL: 169, tylenol & ASA levels WNL ?COVID-19 & Influenza A/B: negative ?ABG: 7.22/ 21/ 206/ 8.6 ?UDS: + Marijuana ?UA: +ketones, +pyuria ?CXR 09/11/2021: mild bibasilar atelectasis ?CT head wo contrast 09/24/2021: Diffuse sulcal effacement and loss of gray-white differentiation throughout the cerebral hemispheres, highly suspicious for cerebral ?edema. ?CT cervical spine wo contrast 09/06/2021: No acute fracture or traumatic subluxation of the cervical spine. Straightening of normal lordosis may be due to positioning or muscle spasm. Orogastric tube is looped in the pharynx, but extends into the ?visualized esophagus. ? ?PCCM consulted for admission due to cardiac arrest with hemodynamic instability requiring mechanical ventilatory support. ?Pertinent  Medical History  ?T2DM ?HTN ?Anxiety ?Current everyday smoker ? ?Significant Hospital Events: ?Including procedures, antibiotic start and stop dates in addition to other pertinent events   ?10/01/21: Admit to ICU post cardiac arrest on normothermia protocol, vasopressors requiring mechanical ventilation. No brainstem reflexes present, CTH showing cerebral edema. ?2022-10-14 Signs of brain death, severe neurogenic shock ? ?Interim History / Subjective:  ?RASS: -5 without any sedation ?no brainstem reflexes,  ?pupils: #6 non reactive  ?NEG for oculocephalic reflexes ?No spontaneous respirations ?Fio2 increased to 100% ? ?Vent Mode: PRVC ?FiO2 (%):  [35 %-100 %] 100 % ?Set Rate:  [22 bmp] 22 bmp ?Vt Set:  [500 mL] 500 mL ?PEEP:  [5 cmH20] 5 cmH20 ?Plateau Pressure:  [22 cmH20-28 cmH20] 24 cmH20 ? ?Severe shock on pressors ?+neurogenic shock ? ?Objective   ?Blood pressure 90/62, pulse 99, temperature (!) 97.3 ?F (36.3 ?C), resp. rate (!) 23, height 5\' 9"  (1.753 m), weight 101.5 kg, SpO2 91 %. ?   ?  Vent Mode: PRVC ?FiO2 (%):  [35 %-100 %] 100 % ?Set Rate:  [22 bmp] 22 bmp ?Vt Set:  [500 mL] 500 mL ?PEEP:  [5 cmH20] 5 cmH20 ?Plateau Pressure:  [22  cmH20-28 cmH20] 24 cmH20  ? ?Intake/Output Summary (Last 24 hours) at 10/02/2021 0557 ?Last data filed at 10/02/2021 0500 ?Gross per 24 hour  ?Intake 4168.02 ml  ?Output 1350 ml  ?Net 2818.02 ml  ? ?Filed Weights  ? 10/01/21 0130  ?Weight: 101.5 kg  ? ? ?REVIEW OF SYSTEMS ? ?PATIENT IS UNABLE TO PROVIDE COMPLETE REVIEW OF SYSTEMS DUE TO SEVERE CRITICAL ILLNESS AND TOXIC METABOLIC ENCEPHALOPATHY ? ? ? ?PHYSICAL EXAMINATION: ? ?GENERAL:critically ill appearing, +resp distress ?EYES:FIXED AND DILATED PUPILS ?MOUTH: Moist mucosal membrane. INTUBATED ?NECK: Supple.  ?PULMONARY: +rhonchi, +wheezing ?CARDIOVASCULAR: S1 and S2.  No murmurs  ?GASTROINTESTINAL: Soft, nontender, -distended. Positive bowel sounds.  ?MUSCULOSKELETAL: No swelling, clubbing, or edema.  ?NEUROLOGIC: obtunded ?SKIN:intact,warm,dry ? ? ?Assessment & Plan:  ? ?ACUTE ISCHEMIC CARDIOMYOPATHY AND SEVERE BRAIN DAMAGE AND ANOXIC BRAIN INJURY WITH SEVERE HYPOXIC RESPIRATORY FAILURE SEVERE ACIDOSIS ? ?Severe ACUTE Hypoxic and Hypercapnic Respiratory Failure ?-continue Mechanical Ventilator support ?-Wean Fio2 and PEEP as tolerated ?-VAP/VENT bundle implementation ?- Wean PEEP & FiO2 as tolerated, maintain SpO2 > 88% ?- Head of bed elevated 30 degrees, VAP protocol in place ?- Plateau pressures less than 30 cm H20  ?- Intermittent chest x-ray & ABG PRN ?- Ensure adequate pulmonary hygiene  ? ? ?Cardiac arrest DUE TO ACUTE NSTEMI LIKELY ISCHEMIC CARDIOMYOPATHY ?Circulatory shock/CARDIOGENIC AND NEUROGENIC SHOCK ?PMHx: Current everyday smoker, HTN ?Pressors as needed ?Patient is dying and showing signs of brain death ? ? ? ?NEUROLOGY ?ACUTE TOXIC METABOLIC ENCEPHALOPATHY ?BRAIN DAMAGE AND SIGNS OF BRAIN DEATH ? ? ?PATIENT SHOWING SIGN OF BRAIN DEATH ?WILL NEED TO CALL IN FAMILY ASAP ? ? ? ? ?Best Practice (right click and "Reselect all SmartList Selections" daily)  ?Diet/type: NPO w/ meds via tube ?DVT prophylaxis: SCD * patient received TNKase, will hold off on  CVC placement as long as possible ?GI prophylaxis: PPI ?Lines: N/A ?Foley:  Yes, and it is still needed ?Code Status:  full code ? ? ?Labs   ?CBC: ?Recent Labs  ?Lab 09/13/2021 ?2235 09/27/2021 ?2309 10/02/21 ?0450  ?WBC 10.8* 12.9* 11.4*  ?NEUTROABS  --  5.1  --   ?HGB 13.0 13.0 12.7*  ?HCT 42.9 41.6 40.2  ?MCV 105.1* 103.7* 101.8*  ?PLT 281 293 207  ? ? ? ?Basic Metabolic Panel: ?Recent Labs  ?Lab 09/10/2021 ?2309 09/12/2021 ?2330 10/01/21 ?0345 10/01/21 ?3846 10/01/21 ?6599 10/01/21 ?2018  ?NA  --  131* 135 139 151*  --   ?K  --  3.1* 7.2* 3.0* 2.7* 4.6  ?CL  --  91* 106 103 117*  --   ?CO2  --  18* 16* 27 26  --   ?GLUCOSE  --  559* 313* 255* 189*  --   ?BUN  --  9 12 12 12   --   ?CREATININE  --  1.10 1.04 1.26* 1.13  --   ?CALCIUM  --  11.7* 7.8* 8.2* 7.9*  --   ?MG 3.1*  --   --  1.7  --  1.7  ?PHOS 11.0*  --   --  1.3*  --  6.2*  ? ?GFR: ?Estimated Creatinine Clearance: 96.9 mL/min (by C-G formula based on SCr of 1.13 mg/dL). ?Recent Labs  ?Lab 09/08/2021 ?2232 09/17/2021 ?2235 09/26/2021 ?2309 10/01/21 ?10/09/2021 10/01/21 ?0345 10/02/21 ?0450  ?  PROCALCITON  --   --   --   --  6.61  --   ?WBC  --  10.8* 12.9*  --   --  11.4*  ?LATICACIDVEN >9.0*  --   --  >9.0*  --   --   ? ? ? ?Liver Function Tests: ?Recent Labs  ?Lab 09/14/2021 ?2330 10/01/21 ?5852  ?AST 88* 231*  ?ALT 54* 102*  ?ALKPHOS 99 106  ?BILITOT 0.5 0.5  ?PROT 6.2* 5.6*  ?ALBUMIN 3.0* 2.6*  ? ?No results for input(s): LIPASE, AMYLASE in the last 168 hours. ?No results for input(s): AMMONIA in the last 168 hours. ? ?ABG ?   ?Component Value Date/Time  ? PHART 7.26 (L) 10/01/2021 0514  ? PCO2ART 57 (H) 10/01/2021 0514  ? PO2ART 67 (L) 10/01/2021 0514  ? HCO3 25.6 10/01/2021 0514  ? ACIDBASEDEF 2.3 (H) 10/01/2021 0514  ? O2SAT 93.4 10/01/2021 0514  ?  ? ?Coagulation Profile: ?Recent Labs  ?Lab 09/19/2021 ?2235 09/24/2021 ?2309  ?INR 1.2 1.2  ? ? ? ?Cardiac Enzymes: ?No results for input(s): CKTOTAL, CKMB, CKMBINDEX, TROPONINI in the last 168 hours. ? ?HbA1C: ?Hgb A1c MFr Bld   ?Date/Time Value Ref Range Status  ?10/01/2021 03:45 AM 9.6 (H) 4.8 - 5.6 % Final  ?  Comment:  ?  (NOTE) ?Pre diabetes:          5.7%-6.4% ? ?Diabetes:              >6.4% ? ?Glycemic control for   <7.0% ?a

## 2021-10-02 NOTE — Procedures (Signed)
Arterial Catheter Insertion Procedure Note ? ?Jose Winters  ?[id number]  ?[birth year] ? ?Date:10/02/21  ?Time:2:45 PM  ? ? ?Provider Performing: Ezequiel Essex  ? ? ?Procedure: Insertion of Arterial Line (27741) with US guidance (28786)  ? ?Indication(s) ?Blood pressure monitoring and/or need for frequent ABGs ? ?Consent ?Risks of the procedure as well as the alternatives and risks of each were explained to the patient and/or caregiver.  Consent for the procedure was obtained and is signed in the bedside chart ? ?Anesthesia ?None ? ? ?Time Out ?Verified patient identification, verified procedure, site/side was marked, verified correct patient position, special equipment/implants available, medications/allergies/relevant history reviewed, required imaging and test results available. ? ? ?Sterile Technique ?Maximal sterile technique including full sterile barrier drape, hand hygiene, sterile gown, sterile gloves, mask, hair covering, sterile ultrasound probe cover (if used). ? ? ?Procedure Description ?Area of catheter insertion was cleaned with chlorhexidine and draped in sterile fashion. With real-time ultrasound guidance an arterial catheter was placed into the left femoral artery.  Appropriate arterial tracings confirmed on monitor.   ? ? ?Complications/Tolerance ?None; patient tolerated the procedure well. ? ? ?EBL ?Minimal ? ? ?Specimen(s) ?None ? ?Attempted right radial arterial line, however unable to thread guidewire.  Therefore, right femoral arterial line successfully placed utilizing ultrasound.  ? ?Jose Winters, AGNP  ?Pulmonary/Critical Care ?Pager [phone] (please enter 7 digits) ?PCCM Consult Pager [phone] (please enter 7 digits)  ?

## 2021-10-02 NOTE — Progress Notes (Signed)
Patient was transported to MRI earlier. O2 increased during procedure as patient dropped sats. Have attempted to wean o2 down however patient remains at 100%.  ?

## 2021-10-02 NOTE — Progress Notes (Signed)
?   10/02/21 0945  ?Clinical Encounter Type  ?Visited With Family  ?Visit Type Follow-up  ?Referral From Chaplain  ? ?Chaplain followed up to provide. ?

## 2021-10-02 NOTE — IPAL (Addendum)
?  Interdisciplinary Goals of Care Family Meeting ? ? ?Date carried out: 10/02/2021 ? ?Location of the meeting: Bedside ? ?Member's involved: Physician, Bedside Registered Nurse, and Family Member or next of kin ? ? ? ? ?Code status: Full DNR ? ?Disposition: Continue current acute care ? ? ? ?GOALS OF CARE DISCUSSION ? ?The Clinical status was relayed to family in detail- ?Sister at bedside ? ?Updated and notified of patients medical condition- ?Patient remains unresponsive and will not open eyes to command.   ?No brain stem reflexes, No spontaneous respirations ? ? ?Explained to family course of therapy and the modalities  ? ?Patient with Progressive multiorgan failure with a very high probablity of a very minimal chance of meaningful recovery despite all aggressive and optimal medical therapy.  ? ? ?Family understands the situation. ? ?Family  have consented and agreed to DNR/DNI and would like to proceed with Comfort care measures when the whole family arrives ? ? ?Family are satisfied with Plan of action and management. All questions answered ? ?Additional CC time 25 mins ? ? ?Lucie Leather, M.D.  ?[facility]  ?Medical Director [facility] ?Medical Director [facility] Cardio-Pulmonary Department  ? ? ? ? ?

## 2021-10-02 NOTE — Progress Notes (Signed)
Chaplain was paged to visit with family, including 46 year old child. Chaplain met with family at bedside.  ? ?Chaplain offered compassionate presence.  ? ?Please contact Chaplain services should family require assistance.  ?

## 2021-10-02 NOTE — Progress Notes (Signed)
Recruitment maneuver performed per instructions from donor coordinator. Abg to follow. ?

## 2021-10-02 NOTE — Progress Notes (Deleted)
Brain death assessment requested from neurology. Unable to be performed today 2/2 metabolic acidosis. Will perform Mon or when metabolic derangements are corrected. ? ?Bing Neighbors, MD ?[facility] ?[phone] ? ?If 7pm- 7am, please page neurology on call as listed in AMION. ? ?

## 2021-10-02 NOTE — Progress Notes (Addendum)
? ?NAME:  Jose Winters, MRN:  [id number], DOB:  [birth year], LOS: 1 ?ADMISSION DATE:  09/12/2021, CONSULTATION DATE:  10/01/21 ?REFERRING MD:  Dr. Fuller Plan, CHIEF COMPLAINT:  Cardiac Arrest  ? ?History of Present Illness:  ?46 yo M presenting to [facility] ED on 09/04/2021 with CPR in progress after becoming unresponsive at a neighbor's house. Sister, [name], who arrived bedside stated she saw her brother earlier in the evening for "a couple of beers" at a local restaurant/bar. She reported he had no complaints, and was in his usual state of health. EMS reported upon their arrival he had pulses and was agonally breathing. Patient deteriorated into PEA, 2 episodes of V-fib with 2 defibrillations, he received 300 mg of amio. With ROSC, EKG was concerning for a STEMI. Pulses were lost again en route. ?ED course: ?Upon arrival ED continued ACLS measures until ROSC. Patient emergently intubated on mechanical ventilation. Concern for ischemia and CODE STEMI called. Patient received TNKase just prior to ROSC due to concern for STEMI. CTH negative for ICH but concerning for anoxic injury. Dr. Darrold Junker assessed the patient bedside, deciding he was not a candidate for cardiac cath at this time.  ?Medications given: TNKase, 1 L IVF, sodium bicarb, ACLS medications, epinephrine & levophed drips started ?Initial Vitals: 94.1, 21, 84, 100/56, 96% on BVM ?Significant labs: (Labs/ Imaging personally reviewed) ?I, Cheryll Cockayne Rust-Chester, AGACNP-BC, personally viewed and interpreted this ECG. ?EKG Interpretation: Date: 09/20/2021, EKG Time: 23:36, Rate: 101, Rhythm: NSR, QRS Axis:  normal, Intervals: normal, ST/T Wave abnormalities: non specific T wave inversion leads: I, II/ mild STE in I, II & mild ST depression in V5-V6, Narrative Interpretation: NSR with concern for ischemia ?Chemistry: Na+: 131, K+: 3.1, BUN/Cr.: 9/1.10, Serum CO2/ AG: 18/22, AST: 88, ALT: 54, Ca: 11.7, Phos:11, Mg: 3.1 ?Hematology: WBC: 12.9, Hgb: 13,  ?Troponin: 46 >279 ,  BNP: 61.4, Lactic/ PCT: >9/pending, BAL: 169, tylenol & ASA levels WNL ?COVID-19 & Influenza A/B: negative ?ABG: 7.22/ 21/ 206/ 8.6 ?UDS: + Marijuana ?UA: +ketones, +pyuria ?CXR 09/11/2021: mild bibasilar atelectasis ?CT head wo contrast 09/24/2021: Diffuse sulcal effacement and loss of gray-white differentiation throughout the cerebral hemispheres, highly suspicious for cerebral ?edema. ?CT cervical spine wo contrast 09/06/2021: No acute fracture or traumatic subluxation of the cervical spine. Straightening of normal lordosis may be due to positioning or muscle spasm. Orogastric tube is looped in the pharynx, but extends into the ?visualized esophagus. ? ?PCCM consulted for admission due to cardiac arrest with hemodynamic instability requiring mechanical ventilatory support. ?Pertinent  Medical History  ?T2DM ?HTN ?Anxiety ?Current everyday smoker ? ?Significant Hospital Events: ?Including procedures, antibiotic start and stop dates in addition to other pertinent events   ?10/01/21: Admit to ICU post cardiac arrest on normothermia protocol, vasopressors requiring mechanical ventilation. No brainstem reflexes present, CTH showing cerebral edema. ?2022-10-14 Signs of brain death, severe neurogenic shock ? ?Interim History / Subjective:  ?RASS: -5 without any sedation ?no brainstem reflexes,  ?pupils: #6 non reactive  ?NEG for oculocephalic reflexes ?No spontaneous respirations ?Fio2 increased to 100% ? ?Vent Mode: PRVC ?FiO2 (%):  [35 %-100 %] 100 % ?Set Rate:  [22 bmp] 22 bmp ?Vt Set:  [500 mL] 500 mL ?PEEP:  [5 cmH20] 5 cmH20 ?Plateau Pressure:  [22 cmH20-28 cmH20] 24 cmH20 ? ?Severe shock on pressors ?+neurogenic shock ? ?Objective   ?Blood pressure 90/62, pulse 99, temperature (!) 97.3 ?F (36.3 ?C), resp. rate (!) 23, height 5\' 9"  (1.753 m), weight 101.5 kg, SpO2 91 %. ?   ?  Vent Mode: PRVC ?FiO2 (%):  [35 %-100 %] 100 % ?Set Rate:  [22 bmp] 22 bmp ?Vt Set:  [500 mL] 500 mL ?PEEP:  [5 cmH20] 5 cmH20 ?Plateau Pressure:  [22  cmH20-28 cmH20] 24 cmH20  ? ?Intake/Output Summary (Last 24 hours) at 10/02/2021 0607 ?Last data filed at 10/02/2021 0500 ?Gross per 24 hour  ?Intake 3464.05 ml  ?Output 1315 ml  ?Net 2149.05 ml  ? ? ?Filed Weights  ? 10/01/21 0130  ?Weight: 101.5 kg  ? ? ?REVIEW OF SYSTEMS ? ?PATIENT IS UNABLE TO PROVIDE COMPLETE REVIEW OF SYSTEMS DUE TO SEVERE CRITICAL ILLNESS AND TOXIC METABOLIC ENCEPHALOPATHY ? ? ? ?PHYSICAL EXAMINATION: ? ?GENERAL:critically ill appearing, +resp distress ?EYES:FIXED AND DILATED PUPILS ?MOUTH: Moist mucosal membrane. INTUBATED ?NECK: Supple.  ?PULMONARY: +rhonchi, +wheezing ?CARDIOVASCULAR: S1 and S2.  No murmurs  ?GASTROINTESTINAL: Soft, nontender, -distended. Positive bowel sounds.  ?MUSCULOSKELETAL: No swelling, clubbing, or edema.  ?NEUROLOGIC: obtunded ?SKIN:intact,warm,dry ? ? ?Assessment & Plan:  ? ?ACUTE ISCHEMIC CARDIOMYOPATHY AND SEVERE BRAIN DAMAGE AND ANOXIC BRAIN INJURY WITH SEVERE HYPOXIC RESPIRATORY FAILURE SEVERE ACIDOSIS ? ?Severe ACUTE Hypoxic and Hypercapnic Respiratory Failure ?-continue Mechanical Ventilator support ?-Wean Fio2 and PEEP as tolerated ?-VAP/VENT bundle implementation ?- Wean PEEP & FiO2 as tolerated, maintain SpO2 > 88% ?- Head of bed elevated 30 degrees, VAP protocol in place ?- Plateau pressures less than 30 cm H20  ?- Intermittent chest x-ray & ABG PRN ?- Ensure adequate pulmonary hygiene  ? ? ?Cardiac arrest DUE TO ACUTE NSTEMI LIKELY ISCHEMIC CARDIOMYOPATHY ?Circulatory shock/CARDIOGENIC AND NEUROGENIC SHOCK ?PMHx: Current everyday smoker, HTN ?Pressors as needed ?Patient is dying and showing signs of brain death ? ? ? ?NEUROLOGY ?ACUTE TOXIC METABOLIC ENCEPHALOPATHY ?BRAIN DAMAGE AND SIGNS OF BRAIN DEATH ? ? ?PATIENT SHOWING SIGN OF BRAIN DEATH ?WILL NEED TO CALL IN FAMILY ASAP ? ? ? ? ?Best Practice (right click and "Reselect all SmartList Selections" daily)  ?Diet/type: NPO w/ meds via tube ?DVT prophylaxis: SCD * patient received TNKase, will hold off  on CVC placement as long as possible ?GI prophylaxis: PPI ?Lines: N/A ?Foley:  Yes, and it is still needed ?Code Status:  full code ? ? ?Labs   ?CBC: ?Recent Labs  ?Lab 09/19/2021 ?2235 09/09/2021 ?2309 10/02/21 ?0450  ?WBC 10.8* 12.9* 11.4*  ?NEUTROABS  --  5.1  --   ?HGB 13.0 13.0 12.7*  ?HCT 42.9 41.6 40.2  ?MCV 105.1* 103.7* 101.8*  ?PLT 281 293 207  ? ? ? ?Basic Metabolic Panel: ?Recent Labs  ?Lab 09/09/2021 ?2309 09/05/2021 ?2330 10/01/21 ?0345 10/01/21 ?[id number] 10/01/21 ?[id number] 10/01/21 ?2018  ?NA  --  131* 135 139 151*  --   ?K  --  3.1* 7.2* 3.0* 2.7* 4.6  ?CL  --  91* 106 103 117*  --   ?CO2  --  18* 16* 27 26  --   ?GLUCOSE  --  559* 313* 255* 189*  --   ?BUN  --  9 12 12 12   --   ?CREATININE  --  1.10 1.04 1.26* 1.13  --   ?CALCIUM  --  11.7* 7.8* 8.2* 7.9*  --   ?MG 3.1*  --   --  1.7  --  1.7  ?PHOS 11.0*  --   --  1.3*  --  6.2*  ? ? ?GFR: ?Estimated Creatinine Clearance: 96.9 mL/min (by C-G formula based on SCr of 1.13 mg/dL). ?Recent Labs  ?Lab 09/26/2021 ?2232 09/20/2021 ?2235 09/09/2021 ?2309 10/01/21 ?[id number] 10/01/21 ?0345 10/02/21 ?  7408  ?PROCALCITON  --   --   --   --  6.61  --   ?WBC  --  10.8* 12.9*  --   --  11.4*  ?LATICACIDVEN >9.0*  --   --  >9.0*  --   --   ? ? ? ?Liver Function Tests: ?Recent Labs  ?Lab Oct 17, 2021 ?2330 10/01/21 ?1448  ?AST 88* 231*  ?ALT 54* 102*  ?ALKPHOS 99 106  ?BILITOT 0.5 0.5  ?PROT 6.2* 5.6*  ?ALBUMIN 3.0* 2.6*  ? ? ?No results for input(s): LIPASE, AMYLASE in the last 168 hours. ?No results for input(s): AMMONIA in the last 168 hours. ? ?ABG ?   ?Component Value Date/Time  ? PHART 7.26 (L) 10/01/2021 0514  ? PCO2ART 57 (H) 10/01/2021 0514  ? PO2ART 67 (L) 10/01/2021 0514  ? HCO3 25.6 10/01/2021 0514  ? ACIDBASEDEF 2.3 (H) 10/01/2021 0514  ? O2SAT 93.4 10/01/2021 0514  ?  ? ?Coagulation Profile: ?Recent Labs  ?Lab Oct 17, 2021 ?2235 Oct 17, 2021 ?2309  ?INR 1.2 1.2  ? ? ? ?Cardiac Enzymes: ?No results for input(s): CKTOTAL, CKMB, CKMBINDEX, TROPONINI in the last 168 hours. ? ?HbA1C: ?Hgb A1c  MFr Bld  ?Date/Time Value Ref Range Status  ?10/01/2021 03:45 AM 9.6 (H) 4.8 - 5.6 % Final  ?  Comment:  ?  (NOTE) ?Pre diabetes:          5.7%-6.4% ? ?Diabetes:              >6.4% ? ?Glycemic control for   <7

## 2021-10-02 NOTE — Progress Notes (Signed)
Neurology brief update ? ?MRI brain showed abnormal diffusion signal throughout cerebral cortex, bilat cerebellum, and deep gray nuclei c/w anoxic brain damage. Patient's family has elected to withdraw care. Neurology will sign off, but please re-engage if we can be of additional assistance. ? ?Bing Neighbors, MD ?[facility] ?[phone] ? ?If 7pm- 7am, please page neurology on call as listed in AMION. ? ?

## 2021-10-02 NOTE — Consult Note (Signed)
NEUROLOGY CONSULTATION NOTE  ? ?Date of service: October 02, 2021 ?Patient Name: Jose Winters ?MRN:  [id number] ?DOB:  Sep 10, 1975 ?Reason for consult: prognostication after cardiac arrest ?Requesting physician: Dr. Erin Fulling ?_ _ _   _ __   _ __ _ _  __ __   _ __   __ _ ? ?History of Present Illness  ? ?This is a 46 year old gentleman presenting to [facility] ED on September 30, 2021 in cardiac arrest with CPR in progress after becoming unresponsive at a neighbor's house.  Patient went out earlier in the evening for a couple of beers at a [employer].  He had no complaints at that time was in his usual state of health.  He became unresponsive and EMS reported on their arrival that he had pulses with agonal breath.  Patient soon deteriorated into PEA, had 2 episodes of V-fib with 2 defibrillations, and received 300 mg of amiodarone.  He achieved ROSC and EKG at that time was concerning for STEMI.  Pulses were lost again in route to the ED and ROSC was again achieved in ED.  Patient was emergently intubated.  Code STEMI was called and he received TNK for this.  Cardiology determined that he was not a candidate for cardiac cath.  CT head showed extensive cerebral edema.  He is now on hypertonic saline. Patient was also found to be in DKA. Neurology is consulted for prognostication after cardiac arrest. ?  ?ROS  ? ?UTA 2/2 coma ? ?Past History  ? ?I have reviewed the following: ? ?Past Medical History:  ?Diagnosis Date  ? Arthritis   ? Diabetes (HCC)   ? Hypertension   ? ?No past surgical history on file. ?No family history on file. ?Social History  ? ?Socioeconomic History  ? Marital status: Single  ?  Spouse name: Not on file  ? Number of children: Not on file  ? Years of education: Not on file  ? Highest education level: Not on file  ?Occupational History  ? Not on file  ?Tobacco Use  ? Smoking status: Every Day  ?  Packs/day: 0.50  ?  Types: Cigarettes  ? Smokeless tobacco: Never  ?Substance and Sexual Activity  ?  Alcohol use: Yes  ? Drug use: Yes  ?  Types: Marijuana  ?  Comment: last smoked earlier today  ? Sexual activity: Not on file  ?Other Topics Concern  ? Not on file  ?Social History Narrative  ? Not on file  ? ?Social Determinants of Health  ? ?Financial Resource Strain: Not on file  ?Food Insecurity: Not on file  ?Transportation Needs: Not on file  ?Physical Activity: Not on file  ?Stress: Not on file  ?Social Connections: Not on file  ? ?Allergies  ?Allergen Reactions  ? Penicillins Rash  ?  Pt thinks he's allergic but not positive;   ? ? ?Medications  ? ?Medications Prior to Admission  ?Medication Sig Dispense Refill Last Dose  ? buPROPion (WELLBUTRIN XL) 150 MG 24 hr tablet Take 150 mg by mouth daily.   Past Week  ? hydrochlorothiazide (HYDRODIURIL) 12.5 MG tablet Take 12.5 mg by mouth daily.   Past Week  ? losartan (COZAAR) 50 MG tablet Take 50 mg by mouth daily.   Past Week  ? metFORMIN (GLUCOPHAGE-XR) 500 MG 24 hr tablet Take 500 mg by mouth daily.   Past Week  ?  ? ? ?Current Facility-Administered Medications:  ?  cefTRIAXone (ROCEPHIN) 2 g in sodium chloride  0.9 % 100 mL IVPB, 2 g, Intravenous, Q24H, Rust-Chester, Cecelia ByarsBritton L, NP, Stopped at 10/02/21 0155 ?  Chlorhexidine Gluconate Cloth 2 % PADS 6 each, 6 each, Topical, Daily, Erin FullingKasa, Kurian, MD, 6 each at 10/01/21 1221 ?  docusate (COLACE) 50 MG/5ML liquid 100 mg, 100 mg, Per Tube, BID, Rust-Chester, Britton L, NP, 100 mg at 10/01/21 0859 ?  EPINEPHrine (ADRENALIN) 1 MG/10ML injection, , Intravenous, PRN, Concha SeFunke, Mary E, MD, 1 mg at 09/08/2021 2220 ?  EPINEPHrine (ADRENALIN) 5 mg in NS 250 mL (0.02 mg/mL) premix infusion, 0.5-20 mcg/min, Intravenous, Titrated, Concha SeFunke, Mary E, MD, Last Rate: 45 mL/hr at 10/02/21 0700, 15 mcg/min at 10/02/21 0700 ?  fentaNYL (SUBLIMAZE) injection 50 mcg, 50 mcg, Intravenous, Q15 min PRN, Rust-Chester, Britton L, NP ?  fentaNYL (SUBLIMAZE) injection 50-200 mcg, 50-200 mcg, Intravenous, Q30 min PRN, Rust-Chester, Britton L, NP ?   insulin aspart (novoLOG) injection 0-9 Units, 0-9 Units, Subcutaneous, Q4H, Rust-Chester, Britton L, NP, 2 Units at 10/02/21 0342 ?  midazolam (VERSED) injection 2 mg, 2 mg, Intravenous, Q15 min PRN, Rust-Chester, Micheline RoughBritton L, NP ?  midazolam (VERSED) injection 2 mg, 2 mg, Intravenous, Q2H PRN, Rust-Chester, Britton L, NP ?  norepinephrine (LEVOPHED) 16 mg in 250mL premix infusion, 0-40 mcg/min, Intravenous, Titrated, Rust-Chester, Britton L, NP, Last Rate: 32.8 mL/hr at 10/02/21 0700, 35 mcg/min at 10/02/21 0700 ?  pantoprazole sodium (PROTONIX) 40 mg/20 mL oral suspension 40 mg, 40 mg, Per Tube, Daily, Rust-Chester, Britton L, NP, 40 mg at 10/01/21 0859 ?  polyethylene glycol (MIRALAX / GLYCOLAX) packet 17 g, 17 g, Per Tube, Daily, Rust-Chester, Britton L, NP, 17 g at 10/01/21 0858 ? ?Vitals  ? ?Vitals:  ? 10/02/21 0600 10/02/21 0615 10/02/21 0700 10/02/21 0745  ?BP: (!) 96/59 (!) 91/57  (!) 85/52  ?Pulse: 98 99  (!) 101  ?Resp: (!) 22 (!) 22  (!) 22  ?Temp: (!) 97.5 ?F (36.4 ?C) (!) 97.3 ?F (36.3 ?C) 98.1 ?F (36.7 ?C) 98.1 ?F (36.7 ?C)  ?TempSrc:      ?SpO2: 93% 92%  90%  ?Weight:      ?Height:      ?  ? ?Body mass index is 34.77 kg/m?. ? ?Physical Exam  ? ?Patient off sedation ? ?Physical Exam ?Gen: comatose off sedation, no response to commands or noxious stimuli ?CV: RRR ?Resp: ventilated ? ?Neuro: ?*MS: comatose off sedation, no response to commands or noxious stimuli ?*Speech: intubated, no attempts to speak ?*CN: pupils 4mm fixed, dilated with no rxn to light, (-) oculocephalics, corneal, cough, gag ?*Motor & sensory: no response to noxious stimuli. Occasionally with vigorous sternal rub patient will have a brief reflexic subtle head movement to L ?*Reflexes: 1+ symm throughout, toes mute bilat ? ?Labs  ? ?CBC:  ?Recent Labs  ?Lab 09/22/2021 ?2309 10/02/21 ?0450  ?WBC 12.9* 11.4*  ?NEUTROABS 5.1  --   ?HGB 13.0 12.7*  ?HCT 41.6 40.2  ?MCV 103.7* 101.8*  ?PLT 293 207  ? ? ?Basic Metabolic Panel:  ?Lab Results   ?Component Value Date  ? NA 147 (H) 10/02/2021  ? K 4.3 10/02/2021  ? CO2 27 10/02/2021  ? GLUCOSE 164 (H) 10/02/2021  ? BUN 23 (H) 10/02/2021  ? CREATININE 2.22 (H) 10/02/2021  ? CALCIUM 7.6 (L) 10/02/2021  ? GFRNONAA 36 (L) 10/02/2021  ? GFRAA >60 04/05/2019  ? ?Lipid Panel: No results found for: LDLCALC ?HgbA1c:  ?Lab Results  ?Component Value Date  ? HGBA1C 9.6 (H) 10/01/2021  ? ?Urine  Drug Screen:  ?   ?Component Value Date/Time  ? LABOPIA NONE DETECTED 27-Oct-2021 2246  ? COCAINSCRNUR NONE DETECTED 2021/10/27 2246  ? LABBENZ NONE DETECTED Oct 27, 2021 2246  ? AMPHETMU NONE DETECTED Oct 27, 2021 2246  ? THCU POSITIVE (A) Oct 27, 2021 2246  ? LABBARB NONE DETECTED 10-27-2021 2246  ?  ?Alcohol Level  ?   ?Component Value Date/Time  ? ETH <10 10/01/2021 0345  ? ? ? ?Impression  ? ?46 yo gentleman with DM2 on whom neurology is consulted for prognostication after cardiac arrest of unclear etiology. Off sedation patient has no brainstem reflexes and no meaningful response to noxious stimuli. Occasional brief movements including subtle head movement to L during vigorous sternal rub are felt to be spinal reflexes given overall clinical exam c/w impending herniation. I discussed this with sisters at bedside and told them that given his exam and large amount of edema even on head CT immediately after the event, his chance for a meaningful recovery is felt to be exceedingly small.  ? ?Recommendations  ? ?- MRI brain wo contrast ? ?This patient is critically ill and at significant risk of neurological worsening, death and care requires constant monitoring of vital signs, hemodynamics,respiratory and cardiac monitoring, neurological assessment, discussion with family, other specialists and medical decision making of high complexity. I spent 60 minutes of neurocritical care time  in the care of  this patient. This was time spent independent of any time provided by nurse practitioner or PA. ? ?Bing Neighbors, MD ?[facility] ?[phone] ? ?If 7pm- 7am, please page neurology on call as listed in AMION. ? ?

## 2021-10-03 ENCOUNTER — Inpatient Hospital Stay
Admit: 2021-10-03 | Discharge: 2021-10-03 | Disposition: A | Discharge status: Home / Self Care | Payer: Self-pay | Attending: Critical Care Medicine | Admitting: Critical Care Medicine

## 2021-10-03 ENCOUNTER — Inpatient Hospital Stay: Payer: Self-pay

## 2021-10-03 ENCOUNTER — Other Ambulatory Visit: Payer: Self-pay

## 2021-10-03 ENCOUNTER — Encounter: Payer: Self-pay | Admitting: Internal Medicine

## 2021-10-03 ENCOUNTER — Encounter: Admission: EM | Disposition: E | Payer: Self-pay | Source: Home / Self Care | Attending: Internal Medicine

## 2021-10-03 DIAGNOSIS — Z529 Donor of unspecified organ or tissue: Secondary | ICD-10-CM

## 2021-10-03 HISTORY — PX: RIGHT/LEFT HEART CATH AND CORONARY/GRAFT ANGIOGRAPHY: CATH118267

## 2021-10-03 LAB — CBC
HCT: 35.7 % — ABNORMAL LOW (ref 39.0–52.0)
HCT: 33.5 % — ABNORMAL LOW (ref 39.0–52.0)
Hemoglobin: 11.5 g/dL — ABNORMAL LOW (ref 13.0–17.0)
Hemoglobin: 10.6 g/dL — ABNORMAL LOW (ref 13.0–17.0)
MCH: 32 pg (ref 26.0–34.0)
MCH: 31.6 pg (ref 26.0–34.0)
MCHC: 32.2 g/dL (ref 30.0–36.0)
MCHC: 31.6 g/dL (ref 30.0–36.0)
MCV: 99.4 fL (ref 80.0–100.0)
MCV: 100 fL (ref 80.0–100.0)
Platelets: 193 10*3/uL (ref 150–400)
Platelets: 173 10*3/uL (ref 150–400)
RBC: 3.59 MIL/uL — ABNORMAL LOW (ref 4.22–5.81)
RBC: 3.35 MIL/uL — ABNORMAL LOW (ref 4.22–5.81)
RDW: 12.8 % (ref 11.5–15.5)
RDW: 13 % (ref 11.5–15.5)
WBC: 13.8 10*3/uL — ABNORMAL HIGH (ref 4.0–10.5)
WBC: 13 10*3/uL — ABNORMAL HIGH (ref 4.0–10.5)
nRBC: 0 % (ref 0.0–0.2)
nRBC: 0 % (ref 0.0–0.2)

## 2021-10-03 LAB — BLOOD GAS, ARTERIAL
Acid-base deficit: 4.9 mmol/L — ABNORMAL HIGH (ref 0.0–2.0)
Acid-base deficit: 4.7 mmol/L — ABNORMAL HIGH (ref 0.0–2.0)
Acid-base deficit: 3.3 mmol/L — ABNORMAL HIGH (ref 0.0–2.0)
Acid-base deficit: 2 mmol/L (ref 0.0–2.0)
Acid-base deficit: 0.2 mmol/L (ref 0.0–2.0)
Acid-base deficit: 0.8 mmol/L (ref 0.0–2.0)
Acid-base deficit: 1.9 mmol/L (ref 0.0–2.0)
Bicarbonate: 22.8 mmol/L (ref 20.0–28.0)
Bicarbonate: 20.4 mmol/L (ref 20.0–28.0)
Bicarbonate: 22.1 mmol/L (ref 20.0–28.0)
Bicarbonate: 23.1 mmol/L (ref 20.0–28.0)
Bicarbonate: 24 mmol/L (ref 20.0–28.0)
Bicarbonate: 24.9 mmol/L (ref 20.0–28.0)
Bicarbonate: 22.2 mmol/L (ref 20.0–28.0)
FIO2: 100 %
FIO2: 100 %
FIO2: 90 %
FIO2: 100 %
FIO2: 100 %
FIO2: 1 %
MECHVT: 500 mL
MECHVT: 500 mL
MECHVT: 500 mL
MECHVT: 500 mL
MECHVT: 500 mL
Mechanical Rate: 30
Mechanical Rate: 30
O2 Saturation: 98.8 %
O2 Saturation: 99.5 %
O2 Saturation: 95.3 %
O2 Saturation: 98 %
O2 Saturation: 97.8 %
O2 Saturation: 88.4 %
O2 Saturation: 98.1 %
PEEP: 5 cmH2O
PEEP: 5 cmH2O
PEEP: 5 cmH2O
PEEP: 5 cmH2O
PEEP: 5 cmH2O
PEEP: 5 cmH2O
PEEP: 8 cmH2O
Patient temperature: 37
Patient temperature: 37
Patient temperature: 37
Patient temperature: 37
Patient temperature: 37
Patient temperature: 37
Patient temperature: 37
RATE: 22 resp/min
RATE: 30 resp/min
RATE: 30 resp/min
RATE: 30 resp/min
RATE: 30 resp/min
Spontaneous VT: 500 mL
pCO2 arterial: 52 mmHg — ABNORMAL HIGH (ref 32–48)
pCO2 arterial: 37 mmHg (ref 32–48)
pCO2 arterial: 40 mmHg (ref 32–48)
pCO2 arterial: 40 mmHg (ref 32–48)
pCO2 arterial: 37 mmHg (ref 32–48)
pCO2 arterial: 44 mmHg (ref 32–48)
pCO2 arterial: 35 mmHg (ref 32–48)
pH, Arterial: 7.25 — ABNORMAL LOW (ref 7.35–7.45)
pH, Arterial: 7.35 (ref 7.35–7.45)
pH, Arterial: 7.35 (ref 7.35–7.45)
pH, Arterial: 7.37 (ref 7.35–7.45)
pH, Arterial: 7.42 (ref 7.35–7.45)
pH, Arterial: 7.36 (ref 7.35–7.45)
pH, Arterial: 7.41 (ref 7.35–7.45)
pO2, Arterial: 90 mmHg (ref 83–108)
pO2, Arterial: 198 mmHg — ABNORMAL HIGH (ref 83–108)
pO2, Arterial: 69 mmHg — ABNORMAL LOW (ref 83–108)
pO2, Arterial: 93 mmHg (ref 83–108)
pO2, Arterial: 79 mmHg — ABNORMAL LOW (ref 83–108)
pO2, Arterial: 61 mmHg — ABNORMAL LOW (ref 83–108)
pO2, Arterial: 89 mmHg (ref 83–108)

## 2021-10-03 LAB — BASIC METABOLIC PANEL
Anion gap: 11 (ref 5–15)
Anion gap: 8 (ref 5–15)
Anion gap: 9 (ref 5–15)
Anion gap: 7 (ref 5–15)
BUN: 31 mg/dL — ABNORMAL HIGH (ref 6–20)
BUN: 32 mg/dL — ABNORMAL HIGH (ref 6–20)
BUN: 34 mg/dL — ABNORMAL HIGH (ref 6–20)
BUN: 36 mg/dL — ABNORMAL HIGH (ref 6–20)
CO2: 22 mmol/L (ref 22–32)
CO2: 25 mmol/L (ref 22–32)
CO2: 24 mmol/L (ref 22–32)
CO2: 23 mmol/L (ref 22–32)
Calcium: 7.9 mg/dL — ABNORMAL LOW (ref 8.9–10.3)
Calcium: 8 mg/dL — ABNORMAL LOW (ref 8.9–10.3)
Calcium: 7.8 mg/dL — ABNORMAL LOW (ref 8.9–10.3)
Calcium: 7.7 mg/dL — ABNORMAL LOW (ref 8.9–10.3)
Chloride: 121 mmol/L — ABNORMAL HIGH (ref 98–111)
Chloride: 121 mmol/L — ABNORMAL HIGH (ref 98–111)
Chloride: 122 mmol/L — ABNORMAL HIGH (ref 98–111)
Chloride: 126 mmol/L — ABNORMAL HIGH (ref 98–111)
Creatinine, Ser: 1.97 mg/dL — ABNORMAL HIGH (ref 0.61–1.24)
Creatinine, Ser: 1.81 mg/dL — ABNORMAL HIGH (ref 0.61–1.24)
Creatinine, Ser: 1.8 mg/dL — ABNORMAL HIGH (ref 0.61–1.24)
Creatinine, Ser: 1.7 mg/dL — ABNORMAL HIGH (ref 0.61–1.24)
GFR, Estimated: 42 mL/min — ABNORMAL LOW (ref >60)
GFR, Estimated: 46 mL/min — ABNORMAL LOW (ref >60)
GFR, Estimated: 47 mL/min — ABNORMAL LOW (ref >60)
GFR, Estimated: 50 mL/min — ABNORMAL LOW (ref >60)
Glucose, Bld: 290 mg/dL — ABNORMAL HIGH (ref 70–99)
Glucose, Bld: 297 mg/dL — ABNORMAL HIGH (ref 70–99)
Glucose, Bld: 309 mg/dL — ABNORMAL HIGH (ref 70–99)
Glucose, Bld: 328 mg/dL — ABNORMAL HIGH (ref 70–99)
Potassium: 4.3 mmol/L (ref 3.5–5.1)
Potassium: 4.1 mmol/L (ref 3.5–5.1)
Potassium: 3.9 mmol/L (ref 3.5–5.1)
Potassium: 3.6 mmol/L (ref 3.5–5.1)
Sodium: 154 mmol/L — ABNORMAL HIGH (ref 135–145)
Sodium: 154 mmol/L — ABNORMAL HIGH (ref 135–145)
Sodium: 155 mmol/L — ABNORMAL HIGH (ref 135–145)
Sodium: 156 mmol/L — ABNORMAL HIGH (ref 135–145)

## 2021-10-03 LAB — ECHOCARDIOGRAM COMPLETE
AR max vel: 2.08 cm2
AV Area VTI: 2.24 cm2
AV Area mean vel: 1.95 cm2
AV Mean grad: 4 mmHg
AV Peak grad: 6 mmHg
Ao pk vel: 1.22 m/s
Area-P 1/2: 4.36 cm2
Height: 69 in
MV VTI: 2.16 cm2
S' Lateral: 2.7 cm
Weight: 3707.26 oz

## 2021-10-03 LAB — GLUCOSE, CAPILLARY
Glucose-Capillary: 261 mg/dL — ABNORMAL HIGH (ref 70–99)
Glucose-Capillary: 276 mg/dL — ABNORMAL HIGH (ref 70–99)
Glucose-Capillary: 280 mg/dL — ABNORMAL HIGH (ref 70–99)
Glucose-Capillary: 298 mg/dL — ABNORMAL HIGH (ref 70–99)
Glucose-Capillary: 309 mg/dL — ABNORMAL HIGH (ref 70–99)
Glucose-Capillary: 319 mg/dL — ABNORMAL HIGH (ref 70–99)

## 2021-10-03 LAB — HEPATIC FUNCTION PANEL
ALT: 62 U/L — ABNORMAL HIGH (ref 0–44)
ALT: 51 U/L — ABNORMAL HIGH (ref 0–44)
AST: 44 U/L — ABNORMAL HIGH (ref 15–41)
AST: 32 U/L (ref 15–41)
Albumin: 2.5 g/dL — ABNORMAL LOW (ref 3.5–5.0)
Albumin: 2.4 g/dL — ABNORMAL LOW (ref 3.5–5.0)
Alkaline Phosphatase: 91 U/L (ref 38–126)
Alkaline Phosphatase: 97 U/L (ref 38–126)
Bilirubin, Direct: 0.1 mg/dL (ref 0.0–0.2)
Bilirubin, Direct: 0.1 mg/dL (ref 0.0–0.2)
Indirect Bilirubin: 0.3 mg/dL (ref 0.3–0.9)
Indirect Bilirubin: 0.4 mg/dL (ref 0.3–0.9)
Total Bilirubin: 0.4 mg/dL (ref 0.3–1.2)
Total Bilirubin: 0.5 mg/dL (ref 0.3–1.2)
Total Protein: 6 g/dL — ABNORMAL LOW (ref 6.5–8.1)
Total Protein: 5.8 g/dL — ABNORMAL LOW (ref 6.5–8.1)

## 2021-10-03 LAB — FIBRINOGEN
Fibrinogen: 667 mg/dL — ABNORMAL HIGH (ref 210–475)
Fibrinogen: 716 mg/dL — ABNORMAL HIGH (ref 210–475)

## 2021-10-03 LAB — TROPONIN I (HIGH SENSITIVITY): Troponin I (High Sensitivity): 185 ng/L (ref <18)

## 2021-10-03 LAB — CKMB (ARMC ONLY): CK, MB: 6.7 ng/mL — ABNORMAL HIGH (ref 0.5–5.0)

## 2021-10-03 LAB — CK: Total CK: 139 U/L (ref 49–397)

## 2021-10-03 SURGERY — RIGHT/LEFT HEART CATH AND CORONARY/GRAFT ANGIOGRAPHY
Anesthesia: Moderate Sedation

## 2021-10-03 MED ORDER — SODIUM CHLORIDE 0.9% FLUSH
3.0000 mL | Freq: Two times a day (BID) | INTRAVENOUS | Status: DC
  Administered 2021-10-03 – 2021-10-05 (×4): 3 mL via INTRAVENOUS

## 2021-10-03 MED ORDER — LIDOCAINE HCL 1 % IJ SOLN
INTRAMUSCULAR | Status: AC
  Filled 2021-10-03: qty 20

## 2021-10-03 MED ORDER — VERAPAMIL HCL 2.5 MG/ML IV SOLN
INTRAVENOUS | Status: AC
  Filled 2021-10-03: qty 2

## 2021-10-03 MED ORDER — HEPARIN SODIUM (PORCINE) 1000 UNIT/ML IJ SOLN
INTRAMUSCULAR | Status: DC | PRN
  Administered 2021-10-03: 4000 [IU] via INTRAVENOUS

## 2021-10-03 MED ORDER — SODIUM CHLORIDE 0.9 % IV SOLN: 250.0000 mL | INTRAVENOUS | Status: DC | PRN

## 2021-10-03 MED ORDER — SODIUM CHLORIDE 0.9 % IV SOLN: INTRAVENOUS | Status: DC

## 2021-10-03 MED ORDER — SODIUM CHLORIDE 0.45 % IV SOLN: INTRAVENOUS | Status: DC

## 2021-10-03 MED ORDER — SODIUM CHLORIDE 0.9% FLUSH: 3.0000 mL | INTRAVENOUS | Status: DC | PRN

## 2021-10-03 MED ORDER — DESMOPRESSIN ACETATE 4 MCG/ML IJ SOLN
2.0000 ug | Freq: Once | INTRAMUSCULAR | Status: AC
  Administered 2021-10-03: 2 ug via INTRAVENOUS
  Filled 2021-10-03: qty 1

## 2021-10-03 MED ORDER — HEPARIN SODIUM (PORCINE) 1000 UNIT/ML IJ SOLN
INTRAMUSCULAR | Status: AC
  Filled 2021-10-03: qty 10

## 2021-10-03 MED ORDER — VERAPAMIL HCL 2.5 MG/ML IV SOLN
INTRAVENOUS | Status: DC | PRN
  Administered 2021-10-03: 2.5 mg via INTRA_ARTERIAL

## 2021-10-03 MED ORDER — TECHNETIUM TC 99M EXAMETAZIME IV KIT
20.0000 | PACK | Freq: Once | INTRAVENOUS | Status: AC | PRN
  Administered 2021-10-03: 20.7 via INTRAVENOUS

## 2021-10-03 MED ORDER — HEPARIN (PORCINE) IN NACL 1000-0.9 UT/500ML-% IV SOLN
INTRAVENOUS | Status: AC
  Filled 2021-10-03: qty 1000

## 2021-10-03 MED ORDER — SODIUM CHLORIDE 0.9 % IV SOLN
INTRAVENOUS | Status: DC
  Filled 2021-10-03 (×14): qty 250

## 2021-10-03 MED ORDER — HEPARIN (PORCINE) IN NACL 2000-0.9 UNIT/L-% IV SOLN
INTRAVENOUS | Status: DC | PRN
Start: 2021-10-03 — End: 2021-10-03
  Administered 2021-10-03: 1000 mL

## 2021-10-03 MED ORDER — IOHEXOL 300 MG/ML  SOLN
INTRAMUSCULAR | Status: DC | PRN
  Administered 2021-10-03: 43 mL

## 2021-10-03 SURGICAL SUPPLY — 12 items
CATH BALLN WEDGE 5F 110CM (CATHETERS) ×1 IMPLANT
CATH INFINITI 5FR JK (CATHETERS) ×1 IMPLANT
DEVICE RAD COMP TR BAND LRG (VASCULAR PRODUCTS) ×1 IMPLANT
DRAPE BRACHIAL (DRAPES) ×2 IMPLANT
GLIDESHEATH SLEND SS 6F .021 (SHEATH) ×1 IMPLANT
GUIDEWIRE INQWIRE 1.5J.035X260 (WIRE) IMPLANT
INQWIRE 1.5J .035X260CM (WIRE) ×2
PACK CARDIAC CATH (CUSTOM PROCEDURE TRAY) ×2 IMPLANT
PROTECTION STATION PRESSURIZED (MISCELLANEOUS) ×2
SET ATX SIMPLICITY (MISCELLANEOUS) ×1 IMPLANT
SHEATH GLIDE SLENDER 4/5FR (SHEATH) ×1 IMPLANT
STATION PROTECTION PRESSURIZED (MISCELLANEOUS) IMPLANT

## 2021-10-03 DEATH — deceased

## 2021-10-04 ENCOUNTER — Inpatient Hospital Stay: Payer: Self-pay | Admitting: Radiology

## 2021-10-04 ENCOUNTER — Encounter: Payer: Self-pay | Admitting: Internal Medicine

## 2021-10-04 HISTORY — PX: IR US GUIDE BX ASP/DRAIN: IMG2392

## 2021-10-04 LAB — COMPREHENSIVE METABOLIC PANEL
ALT: 40 U/L (ref 0–44)
ALT: 39 U/L (ref 0–44)
AST: 32 U/L (ref 15–41)
AST: 36 U/L (ref 15–41)
Albumin: 2.1 g/dL — ABNORMAL LOW (ref 3.5–5.0)
Albumin: 2.1 g/dL — ABNORMAL LOW (ref 3.5–5.0)
Alkaline Phosphatase: 115 U/L (ref 38–126)
Alkaline Phosphatase: 127 U/L — ABNORMAL HIGH (ref 38–126)
Anion gap: 8 (ref 5–15)
Anion gap: 8 (ref 5–15)
BUN: 38 mg/dL — ABNORMAL HIGH (ref 6–20)
BUN: 36 mg/dL — ABNORMAL HIGH (ref 6–20)
CO2: 26 mmol/L (ref 22–32)
CO2: 23 mmol/L (ref 22–32)
Calcium: 8.2 mg/dL — ABNORMAL LOW (ref 8.9–10.3)
Calcium: 8 mg/dL — ABNORMAL LOW (ref 8.9–10.3)
Chloride: 127 mmol/L — ABNORMAL HIGH (ref 98–111)
Chloride: 125 mmol/L — ABNORMAL HIGH (ref 98–111)
Creatinine, Ser: 1.53 mg/dL — ABNORMAL HIGH (ref 0.61–1.24)
Creatinine, Ser: 1.51 mg/dL — ABNORMAL HIGH (ref 0.61–1.24)
GFR, Estimated: 57 mL/min — ABNORMAL LOW (ref >60)
GFR, Estimated: 58 mL/min — ABNORMAL LOW (ref >60)
Glucose, Bld: 150 mg/dL — ABNORMAL HIGH (ref 70–99)
Glucose, Bld: 168 mg/dL — ABNORMAL HIGH (ref 70–99)
Potassium: 3.7 mmol/L (ref 3.5–5.1)
Potassium: 3.4 mmol/L — ABNORMAL LOW (ref 3.5–5.1)
Sodium: 161 mmol/L (ref 135–145)
Sodium: 156 mmol/L — ABNORMAL HIGH (ref 135–145)
Total Bilirubin: 0.6 mg/dL (ref 0.3–1.2)
Total Bilirubin: 0.5 mg/dL (ref 0.3–1.2)
Total Protein: 5.8 g/dL — ABNORMAL LOW (ref 6.5–8.1)
Total Protein: 5.7 g/dL — ABNORMAL LOW (ref 6.5–8.1)

## 2021-10-04 LAB — CBC
HCT: 31.3 % — ABNORMAL LOW (ref 39.0–52.0)
Hemoglobin: 9.6 g/dL — ABNORMAL LOW (ref 13.0–17.0)
MCH: 31.2 pg (ref 26.0–34.0)
MCHC: 30.7 g/dL (ref 30.0–36.0)
MCV: 101.6 fL — ABNORMAL HIGH (ref 80.0–100.0)
Platelets: 155 10*3/uL (ref 150–400)
RBC: 3.08 MIL/uL — ABNORMAL LOW (ref 4.22–5.81)
RDW: 13.2 % (ref 11.5–15.5)
WBC: 9.8 10*3/uL (ref 4.0–10.5)
nRBC: 0 % (ref 0.0–0.2)

## 2021-10-04 LAB — PROTEIN, URINE, RANDOM: Total Protein, Urine: 95 mg/dL

## 2021-10-04 LAB — GLUCOSE, CAPILLARY
Glucose-Capillary: 130 mg/dL — ABNORMAL HIGH (ref 70–99)
Glucose-Capillary: 134 mg/dL — ABNORMAL HIGH (ref 70–99)
Glucose-Capillary: 138 mg/dL — ABNORMAL HIGH (ref 70–99)
Glucose-Capillary: 138 mg/dL — ABNORMAL HIGH (ref 70–99)
Glucose-Capillary: 139 mg/dL — ABNORMAL HIGH (ref 70–99)
Glucose-Capillary: 142 mg/dL — ABNORMAL HIGH (ref 70–99)
Glucose-Capillary: 143 mg/dL — ABNORMAL HIGH (ref 70–99)
Glucose-Capillary: 145 mg/dL — ABNORMAL HIGH (ref 70–99)
Glucose-Capillary: 154 mg/dL — ABNORMAL HIGH (ref 70–99)
Glucose-Capillary: 158 mg/dL — ABNORMAL HIGH (ref 70–99)
Glucose-Capillary: 167 mg/dL — ABNORMAL HIGH (ref 70–99)
Glucose-Capillary: 187 mg/dL — ABNORMAL HIGH (ref 70–99)
Glucose-Capillary: 191 mg/dL — ABNORMAL HIGH (ref 70–99)
Glucose-Capillary: 200 mg/dL — ABNORMAL HIGH (ref 70–99)
Glucose-Capillary: 206 mg/dL — ABNORMAL HIGH (ref 70–99)
Glucose-Capillary: 218 mg/dL — ABNORMAL HIGH (ref 70–99)
Glucose-Capillary: 238 mg/dL — ABNORMAL HIGH (ref 70–99)
Glucose-Capillary: 249 mg/dL — ABNORMAL HIGH (ref 70–99)
Glucose-Capillary: 250 mg/dL — ABNORMAL HIGH (ref 70–99)
Glucose-Capillary: 253 mg/dL — ABNORMAL HIGH (ref 70–99)
Glucose-Capillary: 310 mg/dL — ABNORMAL HIGH (ref 70–99)

## 2021-10-04 LAB — BASIC METABOLIC PANEL
Anion gap: 6 (ref 5–15)
Anion gap: 9 (ref 5–15)
BUN: 38 mg/dL — ABNORMAL HIGH (ref 6–20)
BUN: 37 mg/dL — ABNORMAL HIGH (ref 6–20)
CO2: 25 mmol/L (ref 22–32)
CO2: 25 mmol/L (ref 22–32)
Calcium: 7.7 mg/dL — ABNORMAL LOW (ref 8.9–10.3)
Calcium: 8 mg/dL — ABNORMAL LOW (ref 8.9–10.3)
Chloride: 126 mmol/L — ABNORMAL HIGH (ref 98–111)
Chloride: 127 mmol/L — ABNORMAL HIGH (ref 98–111)
Creatinine, Ser: 1.69 mg/dL — ABNORMAL HIGH (ref 0.61–1.24)
Creatinine, Ser: 1.65 mg/dL — ABNORMAL HIGH (ref 0.61–1.24)
GFR, Estimated: 50 mL/min — ABNORMAL LOW (ref >60)
GFR, Estimated: 52 mL/min — ABNORMAL LOW (ref >60)
Glucose, Bld: 320 mg/dL — ABNORMAL HIGH (ref 70–99)
Glucose, Bld: 176 mg/dL — ABNORMAL HIGH (ref 70–99)
Potassium: 3.6 mmol/L (ref 3.5–5.1)
Potassium: 3.7 mmol/L (ref 3.5–5.1)
Sodium: 157 mmol/L — ABNORMAL HIGH (ref 135–145)
Sodium: 161 mmol/L (ref 135–145)

## 2021-10-04 LAB — HEPATIC FUNCTION PANEL
ALT: 41 U/L (ref 0–44)
AST: 28 U/L (ref 15–41)
Albumin: 2.2 g/dL — ABNORMAL LOW (ref 3.5–5.0)
Alkaline Phosphatase: 96 U/L (ref 38–126)
Bilirubin, Direct: <0.1 mg/dL (ref 0.0–0.2)
Total Bilirubin: 0.3 mg/dL (ref 0.3–1.2)
Total Protein: 5.7 g/dL — ABNORMAL LOW (ref 6.5–8.1)

## 2021-10-04 LAB — ABO/RH: ABO/RH(D): B POS

## 2021-10-04 LAB — URINALYSIS, COMPLETE (UACMP) WITH MICROSCOPIC
Bacteria, UA: NONE SEEN
Bilirubin Urine: NEGATIVE
Glucose, UA: 150 mg/dL — AB
Hgb urine dipstick: NEGATIVE
Ketones, ur: NEGATIVE mg/dL
Leukocytes,Ua: NEGATIVE
Nitrite: NEGATIVE
Protein, ur: 30 mg/dL — AB
Specific Gravity, Urine: 1.024 (ref 1.005–1.030)
Squamous Epithelial / HPF: NONE SEEN (ref 0–5)
pH: 5 (ref 5.0–8.0)

## 2021-10-04 LAB — BLOOD GAS, ARTERIAL
Acid-base deficit: 2.1 mmol/L — ABNORMAL HIGH (ref 0.0–2.0)
Bicarbonate: 22.3 mmol/L (ref 20.0–28.0)
MECHVT: 500 mL
O2 Saturation: 99.3 %
PEEP: 8 cmH2O
Patient temperature: 37
RATE: 30 resp/min
pCO2 arterial: 36 mmHg (ref 32–48)
pH, Arterial: 7.4 (ref 7.35–7.45)
pO2, Arterial: 108 mmHg (ref 83–108)

## 2021-10-04 LAB — URINALYSIS, ROUTINE W REFLEX MICROSCOPIC
Bilirubin Urine: NEGATIVE
Glucose, UA: NEGATIVE mg/dL
Hgb urine dipstick: NEGATIVE
Ketones, ur: NEGATIVE mg/dL
Leukocytes,Ua: NEGATIVE
Nitrite: NEGATIVE
Protein, ur: 30 mg/dL — AB
Specific Gravity, Urine: 1.017 (ref 1.005–1.030)
Squamous Epithelial / HPF: NONE SEEN (ref 0–5)
pH: 5 (ref 5.0–8.0)

## 2021-10-04 LAB — PROTIME-INR
INR: 1.1 (ref 0.8–1.2)
INR: 1.1 (ref 0.8–1.2)
Prothrombin Time: 14.5 seconds (ref 11.4–15.2)
Prothrombin Time: 14.5 seconds (ref 11.4–15.2)

## 2021-10-04 LAB — CK: Total CK: 65 U/L (ref 49–397)

## 2021-10-04 LAB — BILIRUBIN, DIRECT
Bilirubin, Direct: <0.1 mg/dL (ref 0.0–0.2)
Bilirubin, Direct: <0.1 mg/dL (ref 0.0–0.2)

## 2021-10-04 LAB — APTT
aPTT: 35 seconds (ref 24–36)
aPTT: 34 seconds (ref 24–36)

## 2021-10-04 LAB — TROPONIN I (HIGH SENSITIVITY): Troponin I (High Sensitivity): 104 ng/L (ref <18)

## 2021-10-04 LAB — CULTURE, RESPIRATORY W GRAM STAIN: Culture: NORMAL

## 2021-10-04 LAB — CKMB (ARMC ONLY): CK, MB: 1.6 ng/mL (ref 0.5–5.0)

## 2021-10-04 LAB — CREATININE, URINE, RANDOM: Creatinine, Urine: 92 mg/dL

## 2021-10-04 LAB — FIBRINOGEN: Fibrinogen: 763 mg/dL — ABNORMAL HIGH (ref 210–475)

## 2021-10-04 MED ORDER — DEXTROSE 5 % IV BOLUS
1000.0000 mL | Freq: Once | INTRAVENOUS | Status: AC
  Administered 2021-10-04: 1000 mL via INTRAVENOUS

## 2021-10-04 MED ORDER — DEXTROSE 5 % IV SOLN: INTRAVENOUS | Status: DC

## 2021-10-04 MED ORDER — INSULIN REGULAR(HUMAN) IN NACL 100-0.9 UT/100ML-% IV SOLN
INTRAVENOUS | Status: DC
  Administered 2021-10-04: 4.2 [IU]/h via INTRAVENOUS
  Administered 2021-10-04: 4.8 [IU]/h via INTRAVENOUS
  Filled 2021-10-04 (×2): qty 100

## 2021-10-04 MED ORDER — DEXTROSE-NACL 5-0.45 % IV SOLN: INTRAVENOUS | Status: DC

## 2021-10-04 MED ORDER — FREE WATER
250.0000 mL | Status: DC
  Administered 2021-10-04 – 2021-10-05 (×8): 250 mL

## 2021-10-04 MED ORDER — INSULIN ASPART 100 UNIT/ML IJ SOLN: 3.0000 [IU] | INTRAMUSCULAR | Status: DC

## 2021-10-04 MED ORDER — DEXTROSE 50 % IV SOLN: 0.0000 mL | INTRAVENOUS | Status: DC | PRN

## 2021-10-04 NOTE — Progress Notes (Signed)
Orders from [facility] donor coordinator. Change fluids to d5 at 50 ml hr, add 3 hr free water flushes of 250 ml. Continue to monitor.  ?

## 2021-10-04 NOTE — Progress Notes (Signed)
Spoke with [facility] coordinator CDS [phone] regarding patients lab values. Sodium remains at 161. She will contact Joy- previous CDS coordinator [phone]. They will contact us back with further instructions. Continue to assess.  ?

## 2021-10-04 NOTE — Progress Notes (Signed)
Per NP defer patients d5 1/2 ns once blood sugars drop below 250 to honor bridge. Per [facility] coordinator we can make the switch. Continue to assess.   ?

## 2021-10-04 NOTE — Progress Notes (Signed)
?   10/04/21 1500  ?Clinical Encounter Type  ?Visited With Patient and family together  ?Visit Type Follow-up  ?Referral From Family  ?Consult/Referral To Chaplain  ? ?Chaplain followed up with family regarding transition to donor services. Family expressed sadness and tears discussing patient situation. Chaplain provided emotional and spiritual support for family. Chaplain will follow up when honor walk happens. Family appreciated chaplain visit. ?

## 2021-10-04 NOTE — Progress Notes (Signed)
Updated [facility] CDS coordinator regarding patients levophed and synthroid drip titrated off. Spoke with Dr Patsey Berthold regarding Endo tool recommending transition. Per MD we will not transition and continue to use insulin. Continue to assess.   ?

## 2021-10-04 NOTE — Progress Notes (Signed)
Patient with brain death on ventilatory support.  Awaiting organ procurement. ? ?Underwent bedside liver biopsy today. ?Underwent right and left cardiac catheterization last evening. ?Underwent nuclear medicine cerebral blood flow scan consistent with brain death yesterday PM. ? ?Continue supportive care for organ procurement purposes. ?Glycemic control now adequate with IV insulin infusion. ? ?Continue support with pressors and fluids for organ perfusion. ? ?C. Derrill Kay, MD ?Advanced Bronchoscopy ?PCCM [facility] ? ? ? ?*This note was dictated using voice recognition software/Dragon.  Despite best efforts to proofread, errors can occur which can change the meaning. Any transcriptional errors that result from this process are unintentional and may not be fully corrected at the time of dictation. ?

## 2021-10-04 NOTE — Progress Notes (Signed)
eLink Physician-Brief Progress Note ?Patient Name: Jose Winters ?DOB: Aug 23, 1975 ?MRN: [id number] ? ? ?Date of Service ? 10/04/2021  ?HPI/Events of Note ? Patient with sub-optimal glycemic control. Patient needs order for a urinalysis.  ?eICU Interventions ? Resistant insulin scale ordered, Urinalysis ordered.  ? ? ? ?  ? ?Kerry Kass [name] ?10/04/2021, 1:48 AM ?

## 2021-10-04 NOTE — Progress Notes (Signed)
Per Macyla orders entered for UA at 23:00. Covid test within 24 hours of OR schedule, and d5 fluid bolus. Continue to assess.  ?

## 2021-10-05 ENCOUNTER — Encounter: Admission: EM | Disposition: E | Payer: Self-pay | Source: Home / Self Care | Attending: Internal Medicine

## 2021-10-05 ENCOUNTER — Inpatient Hospital Stay: Payer: Self-pay | Admitting: Registered Nurse

## 2021-10-05 ENCOUNTER — Other Ambulatory Visit: Payer: Self-pay

## 2021-10-05 HISTORY — PX: ORGAN PROCUREMENT: SHX5270

## 2021-10-05 LAB — GLUCOSE, CAPILLARY
Glucose-Capillary: 114 mg/dL — ABNORMAL HIGH (ref 70–99)
Glucose-Capillary: 118 mg/dL — ABNORMAL HIGH (ref 70–99)
Glucose-Capillary: 120 mg/dL — ABNORMAL HIGH (ref 70–99)
Glucose-Capillary: 123 mg/dL — ABNORMAL HIGH (ref 70–99)
Glucose-Capillary: 125 mg/dL — ABNORMAL HIGH (ref 70–99)
Glucose-Capillary: 127 mg/dL — ABNORMAL HIGH (ref 70–99)
Glucose-Capillary: 129 mg/dL — ABNORMAL HIGH (ref 70–99)
Glucose-Capillary: 133 mg/dL — ABNORMAL HIGH (ref 70–99)
Glucose-Capillary: 141 mg/dL — ABNORMAL HIGH (ref 70–99)
Glucose-Capillary: 145 mg/dL — ABNORMAL HIGH (ref 70–99)
Glucose-Capillary: 150 mg/dL — ABNORMAL HIGH (ref 70–99)

## 2021-10-05 LAB — CKMB (ARMC ONLY): CK, MB: 0.9 ng/mL (ref 0.5–5.0)

## 2021-10-05 LAB — COMPREHENSIVE METABOLIC PANEL
ALT: 40 U/L (ref 0–44)
ALT: 39 U/L (ref 0–44)
AST: 39 U/L (ref 15–41)
AST: 37 U/L (ref 15–41)
Albumin: 2.2 g/dL — ABNORMAL LOW (ref 3.5–5.0)
Albumin: 2.1 g/dL — ABNORMAL LOW (ref 3.5–5.0)
Alkaline Phosphatase: 126 U/L (ref 38–126)
Alkaline Phosphatase: 124 U/L (ref 38–126)
Anion gap: 8 (ref 5–15)
Anion gap: 10 (ref 5–15)
BUN: 36 mg/dL — ABNORMAL HIGH (ref 6–20)
BUN: 37 mg/dL — ABNORMAL HIGH (ref 6–20)
CO2: 23 mmol/L (ref 22–32)
CO2: 23 mmol/L (ref 22–32)
Calcium: 8.1 mg/dL — ABNORMAL LOW (ref 8.9–10.3)
Calcium: 8.1 mg/dL — ABNORMAL LOW (ref 8.9–10.3)
Chloride: 126 mmol/L — ABNORMAL HIGH (ref 98–111)
Chloride: 125 mmol/L — ABNORMAL HIGH (ref 98–111)
Creatinine, Ser: 1.36 mg/dL — ABNORMAL HIGH (ref 0.61–1.24)
Creatinine, Ser: 1.27 mg/dL — ABNORMAL HIGH (ref 0.61–1.24)
GFR, Estimated: >60 mL/min (ref >60)
GFR, Estimated: >60 mL/min (ref >60)
Glucose, Bld: 134 mg/dL — ABNORMAL HIGH (ref 70–99)
Glucose, Bld: 136 mg/dL — ABNORMAL HIGH (ref 70–99)
Potassium: 3.6 mmol/L (ref 3.5–5.1)
Potassium: 3.6 mmol/L (ref 3.5–5.1)
Sodium: 157 mmol/L — ABNORMAL HIGH (ref 135–145)
Sodium: 158 mmol/L — ABNORMAL HIGH (ref 135–145)
Total Bilirubin: 0.4 mg/dL (ref 0.3–1.2)
Total Bilirubin: 0.6 mg/dL (ref 0.3–1.2)
Total Protein: 6 g/dL — ABNORMAL LOW (ref 6.5–8.1)
Total Protein: 5.9 g/dL — ABNORMAL LOW (ref 6.5–8.1)

## 2021-10-05 LAB — SURGICAL PATHOLOGY

## 2021-10-05 LAB — APTT
aPTT: 34 seconds (ref 24–36)
aPTT: 31 seconds (ref 24–36)

## 2021-10-05 LAB — PROTIME-INR
INR: 1.1 (ref 0.8–1.2)
INR: 1.2 (ref 0.8–1.2)
Prothrombin Time: 14.3 seconds (ref 11.4–15.2)
Prothrombin Time: 15.4 seconds — ABNORMAL HIGH (ref 11.4–15.2)

## 2021-10-05 LAB — CK: Total CK: 71 U/L (ref 49–397)

## 2021-10-05 LAB — RESP PANEL BY RT-PCR (FLU A&B, COVID) ARPGX2
Influenza A by PCR: NEGATIVE
Influenza B by PCR: NEGATIVE
SARS Coronavirus 2 by RT PCR: NEGATIVE

## 2021-10-05 LAB — BILIRUBIN, DIRECT
Bilirubin, Direct: <0.1 mg/dL (ref 0.0–0.2)
Bilirubin, Direct: <0.1 mg/dL (ref 0.0–0.2)

## 2021-10-05 SURGERY — SURGICAL PROCUREMENT, ORGAN
Anesthesia: General | Site: Abdomen

## 2021-10-05 MED ORDER — METRONIDAZOLE IVPB CUSTOM
1000.0000 mg | Freq: Once | INTRAVENOUS | Status: AC
  Administered 2021-10-05: 1000 mg via INTRAVENOUS
  Filled 2021-10-05: qty 200

## 2021-10-05 MED ORDER — HEPARIN SODIUM (PORCINE) 1000 UNIT/ML IJ SOLN
INTRAMUSCULAR | Status: AC
  Filled 2021-10-05: qty 20

## 2021-10-05 MED ORDER — HEPARIN SODIUM (PORCINE) 1000 UNIT/ML IJ SOLN
INTRAMUSCULAR | Status: DC | PRN
  Administered 2021-10-05: 31000 [IU] via INTRAVENOUS

## 2021-10-05 MED ORDER — HEPARIN SODIUM (PORCINE) 1000 UNIT/ML IJ SOLN
INTRAMUSCULAR | Status: AC
  Filled 2021-10-05: qty 10

## 2021-10-05 MED ORDER — SODIUM CHLORIDE 0.9 % IV SOLN
1000.0000 mg | Freq: Once | INTRAVENOUS | Status: AC
  Administered 2021-10-05: 1000 mg via INTRAVENOUS
  Filled 2021-10-05: qty 16

## 2021-10-05 MED ORDER — CIPROFLOXACIN IN D5W 400 MG/200ML IV SOLN
400.0000 mg | Freq: Once | INTRAVENOUS | Status: AC
  Administered 2021-10-05: 400 mg via INTRAVENOUS
  Filled 2021-10-05: qty 200

## 2021-10-05 MED ORDER — HEPARIN SODIUM (PORCINE) 10000 UNIT/ML IJ SOLN
31200.0000 [IU] | Freq: Once | INTRAMUSCULAR | Status: DC
  Filled 2021-10-05: qty 31.2
  Filled 2021-10-05: qty 4

## 2021-10-05 SURGICAL SUPPLY — 59 items
APL PRP STRL LF DISP 70% ISPRP (MISCELLANEOUS) ×3
BLADE CLIPPER SURG (BLADE) ×2 IMPLANT
BLADE STERNUM SYSTEM 6 (BLADE) ×2 IMPLANT
BLADE SURG SZ11 CARB STEEL (BLADE) ×1 IMPLANT
CHLORAPREP W/TINT 26 (MISCELLANEOUS) ×5 IMPLANT
CLIP TI MEDIUM 6 (CLIP) ×2 IMPLANT
CLIP TI WIDE RED SMALL 6 (CLIP) ×2 IMPLANT
CNTNR SPEC 2.5X3XGRAD LEK (MISCELLANEOUS) ×1
CONT SPEC 4OZ STER OR WHT (MISCELLANEOUS) ×2
CONT SPEC 4OZ STRL OR WHT (MISCELLANEOUS) ×1
CONTAINER SPEC 2.5X3XGRAD LEK (MISCELLANEOUS) ×1 IMPLANT
COVER BACK TABLE 60X90IN (DRAPES) ×2 IMPLANT
DRSG OPSITE POSTOP 4X12 (GAUZE/BANDAGES/DRESSINGS) IMPLANT
DRSG OPSITE POSTOP 4X14 (GAUZE/BANDAGES/DRESSINGS) IMPLANT
DRSG OPSITE POSTOP 4X8 (GAUZE/BANDAGES/DRESSINGS) IMPLANT
ELECT BLADE 6.5 EXT (BLADE) ×1 IMPLANT
ELECT REM PT RETURN 9FT ADLT (ELECTROSURGICAL) ×4
ELECTRODE REM PT RTRN 9FT ADLT (ELECTROSURGICAL) IMPLANT
GAUZE SPONGE 4X4 12PLY STRL (GAUZE/BANDAGES/DRESSINGS) IMPLANT
GOWN STRL REUS W/ TWL LRG LVL3 (GOWN DISPOSABLE) ×4 IMPLANT
GOWN STRL REUS W/TWL LRG LVL3 (GOWN DISPOSABLE) ×8
HANDLE SUCTION POOLE (INSTRUMENTS) ×2 IMPLANT
KIT POST MORTEM ADULT 36X90 (BAG) ×2 IMPLANT
KIT TURNOVER KIT A (KITS) ×2 IMPLANT
LOOP RED MAXI  1X406MM (MISCELLANEOUS) ×2
LOOP VESSEL MAXI 1X406 RED (MISCELLANEOUS) ×1 IMPLANT
LOOP VESSEL MINI 0.8X406 BLUE (MISCELLANEOUS) ×1 IMPLANT
LOOPS BLUE MINI 0.8X406MM (MISCELLANEOUS) ×2
MANIFOLD NEPTUNE II (INSTRUMENTS) ×5 IMPLANT
PACK BASIN MAJOR ARMC (MISCELLANEOUS) ×2 IMPLANT
PACK UNIVERSAL (MISCELLANEOUS) ×2 IMPLANT
PENCIL ELECTRO HAND CTR (MISCELLANEOUS) ×2 IMPLANT
SPONGE KITTNER 5P (MISCELLANEOUS) ×3 IMPLANT
SPONGE T-LAP 18X18 ~~LOC~~+RFID (SPONGE) ×8 IMPLANT
STAPLER SKIN PROX 35W (STAPLE) ×2 IMPLANT
SUCTION POOLE HANDLE (INSTRUMENTS)
SUT BONE WAX W31G (SUTURE) ×1 IMPLANT
SUT ETHIBOND 2 V 37 (SUTURE) IMPLANT
SUT ETHIBOND NAB CT1 #1 30IN (SUTURE) ×3 IMPLANT
SUT ETHILON 2 0 FS 18 (SUTURE) ×2 IMPLANT
SUT PROLENE 4 0 RB 1 (SUTURE) ×4
SUT PROLENE 4 0 SH DA (SUTURE) ×5 IMPLANT
SUT PROLENE 4-0 RB1 .5 CRCL 36 (SUTURE) IMPLANT
SUT SILK 0 (SUTURE) ×2
SUT SILK 0 30XBRD TIE 6 (SUTURE) ×1 IMPLANT
SUT SILK 1 SH (SUTURE) IMPLANT
SUT SILK 1 TIES 10/18 (SUTURE) ×2 IMPLANT
SUT SILK 2 0 (SUTURE) ×2
SUT SILK 2 0 SH (SUTURE) ×1 IMPLANT
SUT SILK 2 0 SH CR/8 (SUTURE) ×1 IMPLANT
SUT SILK 2-0 18XBRD TIE 12 (SUTURE) ×1 IMPLANT
SUT SILK 3-0 (SUTURE) ×1 IMPLANT
SUT VIC AB 4-0 RB1 27 (SUTURE) ×4
SUT VIC AB 4-0 RB1 27X BRD (SUTURE) IMPLANT
SYR 10ML LL (SYRINGE) ×2 IMPLANT
SYR 20ML LL LF (SYRINGE) ×2 IMPLANT
SYR 50ML LL SCALE MARK (SYRINGE) ×1 IMPLANT
TAPE UMBILICAL 1/8 X36 TWILL (MISCELLANEOUS) ×2 IMPLANT
TOWEL OR 17X26 4PK STRL BLUE (TOWEL DISPOSABLE) ×5 IMPLANT

## 2021-10-05 NOTE — Anesthesia Preprocedure Evaluation (Addendum)
Anesthesia Evaluation  ?Patient identified by MRN, date of birth, ID band ?Patient unresponsive ? ? ? ?Reviewed: ?Allergy & Precautions, Patient's Chart, lab work & pertinent test results ? ?History of Anesthesia Complications ?Negative for: history of anesthetic complications ? ?Airway ?Mallampati: Unable to assess ? ? ? ? ? ?Comment: Intubated  Dental ?  ?Pulmonary ?Current Smoker and Patient abstained from smoking.,  ?  ? ?+ decreased breath sounds ? ? ? ? ? Cardiovascular ?hypertension,  ?Rhythm:Regular Rate:Normal ? ?S/p cardiac arrest in field ? ?ECG 10-08-21:  ?Sinus or ectopic atrial tachycardia ?Abnormal R-wave progression, late transition ?Abnormal T, consider ischemia, diffuse leads ?  ?Neuro/Psych ?PSYCHIATRIC DISORDERS Anxiety Brain death ?  ? GI/Hepatic ?negative GI ROS,   ?Endo/Other  ?diabetes, Type 2Obesity  ? Renal/GU ?negative Renal ROS  ? ?  ?Musculoskeletal ? ?(+) Arthritis ,  ? Abdominal ?  ?Peds ? Hematology ?negative hematology ROS ?(+)   ?Anesthesia Other Findings ?Acute hypoxic respiratory failure in the setting of cardiac arrest with prolonged downtime and resulting in severe anoxic brain injury ? Reproductive/Obstetrics ? ?  ? ? ? ? ? ? ? ? ? ? ? ? ? ?  ?  ? ? ? ? ? ? ? ?Anesthesia Physical ?Anesthesia Plan ? ?ASA: 6 ? ?Anesthesia Plan: General  ? ?Post-op Pain Management:   ? ?Induction: Intravenous ? ?PONV Risk Score and Plan: 1 and Treatment may vary due to age or medical condition ? ?Airway Management Planned: Oral ETT ? ?Additional Equipment: Arterial line ? ?Intra-op Plan:  ? ?Post-operative Plan:  ? ?Informed Consent: I have reviewed the patients History and Physical, chart, labs and discussed the procedure including the risks, benefits and alternatives for the proposed anesthesia with the patient or authorized representative who has indicated his/her understanding and acceptance.  ? ? ? ?Consent reviewed with POA ? ?Plan Discussed with:   ? ?Anesthesia Plan Comments: (Spoke with patient's sister at bedside.)  ? ? ? ? ? ?Anesthesia Quick Evaluation ? ?

## 2021-10-05 NOTE — Anesthesia Postprocedure Evaluation (Signed)
Anesthesia Post Note ? ?Patient: Jose Winters ? ?Procedure(s) Performed: ORGAN PROCUREMENT (Abdomen) ? ?Patient location during evaluation: Other ?Anesthesia Type: General ?Post-procedure mental status: brain death. ?Respiratory status: no longer ventilating. ?Anesthetic complications: no ?Comments: ASA 6 for organ procurement.  Anesthesia stopped once no longer needed by transplant team. ? ? ?No notable events documented. ? ? ?Last Vitals:  ?Vitals:  ? Oct 13, 2021 1000 2021-10-13 1100  ?BP:    ?Pulse:    ?Resp:    ?Temp: 37 ?C 37 ?C  ?SpO2:    ?  ?Last Pain:  ?Vitals:  ? 2021-10-13 0800  ?TempSrc: Rectal  ? ? ?  ?  ?  ?  ?  ?  ? ?Reed Breech ? ? ? ? ?

## 2021-10-05 NOTE — Progress Notes (Signed)
Adult Brain Death Determination ? ?Time of Examination: 10/09/2021 7:45 AM ? ?No Evidence of /Cause of Reversible CNS Depression  ?Core temperature must be greater >36 degrees. Last temp: Temp: 98.6 ?F (37 ?C) (Note: If unable to achieve normothermia after 12 hours of temperature management, may consider proceeding with Brain Death Evaluation.):    no ? ?Evidence of severe metabolic perturbations that could potentate CNS depression. Consider glucose, Na, creatinine, PaCO2, SaO2.:    Absent ? ?Evidence of drugs, by history or measurement, that could potentiate central nervous system depression: narcotics, ethanol, benzodiazepines, barbiturates, neuromuscular blockade.:     Absent ? ?Absence of Cortical Function  ?GCS = 3:    yes ? ?Absence of Brain Stem Reflexes and Responses  ?Pupils light-fixed    yes ? ?Corneal reflexes:    Absent ? ?Response to upper and lower airway stimulation, such as pharyngeal and endotracheal suctioning.:    Absent ? ?Ocular response to head turning (eye movement).    Absent ? ?Absence of Spontaneous Respirations  (Apnea test performed per Brain Death Policy. If not met due to hemodynamic/ventilatory instability, then perform EEG, TCD, or cerebral blow flow studies.)  ?1.   Spontaneous Respirations   Absent ? ?2.   PaCO2 at start of apnea test:  37 ? ?3.   PaCO2 at end of apnea test:  44 ? ?4.   CO2 rise of 20 or greater from baseline:   no * ?*Apnea test had to be aborted after 3 minutes due to hemodynamic instability, no spontaneous respirations noted.  However patient had to be placed back on ventilator due to severe hypotension unresponsive to pressors during the test.  Cerebral blood flow study ordered. ?Document Confirmatory Test Utilized: (Optional) ?Nuclear cerebral flow, cerebral angiography (CT/MR angio), transcranial Doppler ultrasound, EEG, SSEP (record results).  ?Test results (if available): Nuclear cerebral blood flow study shows absent blood flow and delayed uptake tracer  within the brain consistent with brain death. ? ?Patient pronounced dead by neurological criteria at 4:32 PM on Oct 13, 2021. ? ? ? ?Critical Care Time devoted to patient care services described in this note is 35  minutes.  ? ? ?Corrin Parker, M.D.  ?[facility]  ?Medical Director [place] ?Medical Director [facility] Cardio-Pulmonary Department  ? ? ? ? ?

## 2021-10-05 NOTE — Transfer of Care (Signed)
Immediate Anesthesia Transfer of Care Note ? ?Patient: Jose Winters ? ?Procedure(s) Performed: ORGAN PROCUREMENT (Abdomen) ? ?Patient Location: OR ? ?Anesthesia Type:General ? ?Level of Consciousness: comatose ? ?Airway & Oxygen Therapy: organ procurement ? ?Post-op Assessment: organ procurement ? ?Post vital signs: Reviewed ? ?Last Vitals:  ?Vitals Value Taken Time  ?BP    ?Temp    ?Pulse    ?Resp    ?SpO2    ? ? ?Last Pain:  ?Vitals:  ? 10-17-2021 0800  ?TempSrc: Rectal  ?   ? ?  ? ?Complications: No notable events documented. ?

## 2021-10-06 LAB — CULTURE, BLOOD (ROUTINE X 2)
Culture: NO GROWTH
Culture: NO GROWTH
Special Requests: ADEQUATE
Special Requests: ADEQUATE

## 2021-10-10 LAB — SURGICAL PATHOLOGY

## 2021-11-03 NOTE — Interval H&P Note (Signed)
History and Physical Interval Note: ? ?Right and left cardiac catheterization was requested for organ donation consideration. ? ?25-Oct-2021 ?5:28 PM ? ?Jose Winters  has presented today for surgery, with the diagnosis of brain death.  The various methods of treatment have been discussed with the patient and family. After consideration of risks, benefits and other options for treatment, the patient has consented to  Procedure(s): ?RIGHT/LEFT HEART CATH AND CORONARY/GRAFT ANGIOGRAPHY (N/A) as a surgical intervention.  The patient's history has been reviewed, patient examined, no change in status, stable for surgery.  I have reviewed the patient's chart and labs.  Questions were answered to the patient's satisfaction.   ? ? ?Lorine Bears ? ? ?

## 2021-11-03 NOTE — Progress Notes (Addendum)
Adult Brain Death Determination ? ?Time of Examination: 10/22/2021 4:59 PM ? ?No Evidence of /Cause of Reversible CNS Depression  ?Core temperature must be greater >36 degrees. Last temp: Temp: 98.6 ?F (37 ?C) (Note: If unable to achieve normothermia after 12 hours of temperature management, may consider proceeding with Brain Death Evaluation.):    no ? ?Evidence of severe metabolic perturbations that could potentate CNS depression. Consider glucose, Na, creatinine, PaCO2, SaO2.:    Absent ? ?Evidence of drugs, by history or measurement, that could potentiate central nervous system depression: narcotics, ethanol, benzodiazepines, barbiturates, neuromuscular blockade.:     Absent ? ?Absence of Cortical Function  ?GCS = 3:    yes ? ?Absence of Brain Stem Reflexes and Responses  ?Pupils light-fixed    yes ? ?Corneal reflexes:    Absent ? ?Response to upper and lower airway stimulation, such as pharyngeal and endotracheal suctioning.:    Absent ? ?Ocular response to head turning (eye movement).    Absent ? ?Absence of Spontaneous Respirations  (Apnea test performed per Brain Death Policy. If not met due to hemodynamic/ventilatory instability, then perform EEG, TCD, or cerebral blow flow studies.)  ?1.   Spontaneous Respirations   Absent ? ?2.   PaCO2 at start of apnea test:  37 ? ?3.   PaCO2 at end of apnea test:  44 ? ?4.   CO2 rise of 20 or greater from baseline:   no * ?*Apnea test had to be aborted after 3 minutes due to hemodynamic instability, no spontaneous respirations noted.  However patient had to be placed back on ventilator due to severe hypotension unresponsive to pressors during the test.  Cerebral blood flow study ordered. ?Document Confirmatory Test Utilized: (Optional) ?Nuclear cerebral flow, cerebral angiography (CT/MR angio), transcranial Doppler ultrasound, EEG, SSEP (record results).  ?Test results (if available): Nuclear cerebral blood flow study shows absent blood flow and delayed uptake tracer  within the brain consistent with brain death. ? ?Patient pronounced dead by neurological criteria at 4:32 PM on 03 Oct 2021. ? ?C. Laura Jadon Harbaugh, MD ?Advanced Bronchoscopy ?PCCM [facility] ? ?10/27/2021 ?4:59 PM ? ?

## 2021-11-03 NOTE — H&P (View-Only) (Signed)
Adult Brain Death Determination ? ?Time of Examination: 10/27/2021 4:59 PM ? ?No Evidence of /Cause of Reversible CNS Depression  ?Core temperature must be greater >36 degrees. Last temp: Temp: 98.6 ?F (37 ?C) (Note: If unable to achieve normothermia after 12 hours of temperature management, may consider proceeding with Brain Death Evaluation.):    no ? ?Evidence of severe metabolic perturbations that could potentate CNS depression. Consider glucose, Na, creatinine, PaCO2, SaO2.:    Absent ? ?Evidence of drugs, by history or measurement, that could potentiate central nervous system depression: narcotics, ethanol, benzodiazepines, barbiturates, neuromuscular blockade.:     Absent ? ?Absence of Cortical Function  ?GCS = 3:    yes ? ?Absence of Brain Stem Reflexes and Responses  ?Pupils light-fixed    yes ? ?Corneal reflexes:    Absent ? ?Response to upper and lower airway stimulation, such as pharyngeal and endotracheal suctioning.:    Absent ? ?Ocular response to head turning (eye movement).    Absent ? ?Absence of Spontaneous Respirations  (Apnea test performed per Brain Death Policy. If not met due to hemodynamic/ventilatory instability, then perform EEG, TCD, or cerebral blow flow studies.)  ?1.   Spontaneous Respirations   Absent ? ?2.   PaCO2 at start of apnea test:  37 ? ?3.   PaCO2 at end of apnea test:  44 ? ?4.   CO2 rise of 20 or greater from baseline:   no * ?*Apnea test had to be aborted after 3 minutes due to hemodynamic instability, no spontaneous respirations noted.  However patient had to be placed back on ventilator due to severe hypotension unresponsive to pressors during the test.  Cerebral blood flow study ordered. ?Document Confirmatory Test Utilized: (Optional) ?Nuclear cerebral flow, cerebral angiography (CT/MR angio), transcranial Doppler ultrasound, EEG, SSEP (record results).  ?Test results (if available): Nuclear cerebral blood flow study shows absent blood flow and delayed uptake tracer  within the brain consistent with brain death. ? ?Patient pronounced dead by neurological criteria at 4:32 PM on 03 Oct 2021. ? ?C. Laura Gonzalez, MD ?Advanced Bronchoscopy ?PCCM  [facility] ? ?10/11/2021 ?4:59 PM ? ?

## 2021-11-03 NOTE — Progress Notes (Signed)
Recruitment maneuver performed, Abg to follow.  ?

## 2021-11-03 NOTE — Progress Notes (Signed)
Per Dr. Jayme Cloud patient will not be a medical examiners case. Awaiting for nuclear medicine and left/right heart craterization. Liver biopsy scheduled for tomorrow. Family at bedside and updated throughout the day. Continue to assess. ?

## 2021-11-03 NOTE — Progress Notes (Signed)
*  PRELIMINARY RESULTS* ?Echocardiogram ?2D Echocardiogram has been performed. ? ?Jose Winters, Jose Winters ?11/01/2021, 1:53 PM ?

## 2021-11-03 NOTE — Progress Notes (Signed)
[place] [facility] said to wait until after cath to draw anymore abg's.  ?

## 2021-11-03 NOTE — Progress Notes (Signed)
pO2 below acceptable range on 90% fio2 per 2am abg. O2 increased back to 100% ?

## 2021-11-03 NOTE — Progress Notes (Signed)
Pt transported to cath lab on vent without issues.  ?

## 2021-11-03 NOTE — Progress Notes (Signed)
?   10/10/2021 1520  ?Clinical Encounter Type  ?Visited With Patient not available  ? ?Chaplain Burris checked-in with staff as Pt taken for brain function assessment. Chaplain B also debriefed with Henrine Screws E to understand case and potential needs of this family. ? ?Chaplain Amy Burris available overnight as needed. ?

## 2021-11-03 NOTE — Progress Notes (Signed)
Per Dr. Jayme Cloud stopped Normothermia protocol to take patient to Nuclear Medicine. Continue to assess.    ?

## 2021-11-03 NOTE — Progress Notes (Signed)
Recruitment maneuver performed. Abg to follow ?

## 2021-11-03 NOTE — Progress Notes (Signed)
?   Oct 14, 2021 1400  ?Clinical Encounter Type  ?Visited With Patient and family together  ?Visit Type Follow-up  ?Referral From Nurse  ?Consult/Referral To Chaplain  ? ?Chaplain followed up with patient and family. Chaplain provided support while family struggled with EOL decisions. Chaplain provided compassionate presence and reflective listening as family spoke about the current situation. Family appreciated Chaplain visit. ?

## 2021-11-03 NOTE — Death Summary Note (Signed)
?DEATH SUMMARY  ? ?Patient Details  ?Name: Jose Winters ?MRN: [id number] ?DOB: 1975/09/23 ? ?Admission/Discharge Information  ? ?Admit Date:  10/04/21  ?Date of Death: Date of Death: 10/07/2021  ?Time of Death: Time of Death: 09-23-1630  ?Length of Stay: 4  ?Referring Physician: [facility]  ? ?Reason(s) for Hospitalization  ?Cardiac arrest ? ?Diagnoses  ?Preliminary cause of death:  ?Secondary Diagnoses (including complications and co-morbidities):  ?Principal Problem: ?  Cardiac arrest (HCC) ?Active Problems: ?  Brain damage ?  Acidosis ?  Neurogenic shock (HCC) ?  Palliative care status ?  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 ?Brief Hospital Course (including significant findings, care, treatment, and services provided and events leading to death)  ?Jose Winters is a 46 y.o. year old male who presented to [facility] ED on 10/04/2021 with CPR in progress after becoming unresponsive at a neighbor's house. Sister, [name], who arrived bedside stated she saw her brother earlier in the evening for "a couple of beers" at a local restaurant/bar. She reported he had no complaints, and was in his usual state of health. EMS reported upon their arrival he had pulses and was agonally breathing. Patient deteriorated into PEA, 2 episodes of V-fib with 2 defibrillations, he received 300 mg of amio. With ROSC, EKG was concerning for a STEMI. Pulses were lost again en route. ?ED course: ?Upon arrival ED continued ACLS measures until ROSC. Patient emergently intubated on mechanical ventilation. Concern for ischemia and CODE STEMI called. Patient received TNKase just prior to ROSC due to concern for STEMI. CTH negative for ICH but concerning for anoxic injury. Dr. Darrold Junker assessed the patient bedside, deciding he was not a candidate for cardiac cath at this time.  ?Medications given: TNKase, 1 L IVF, sodium bicarb, ACLS  medications, epinephrine & levophed drips started ?Initial Vitals: 94.1, 21, 84, 100/56, 96% on BVM ?Significant labs: (Labs/ Imaging personally reviewed) ?EKG Interpretation: Date: Oct 04, 2021, EKG Time: 23:36, Rate: 101, Rhythm: NSR, QRS Axis:  normal, Intervals: normal, ST/T Wave abnormalities: non specific T wave inversion leads: I, II/ mild STE in I, II & mild ST depression in V5-V6, Narrative Interpretation: NSR with concern for ischemia ?Chemistry: Na+: 131, K+: 3.1, BUN/Cr.: 9/1.10, Serum CO2/ AG: 18/22, AST: 88, ALT: 54, Ca: 11.7, Phos:11, Mg: 3.1 ?Hematology: WBC: 12.9, Hgb: 13,  ?Troponin: 46 >279 , BNP: 61.4, Lactic/ PCT: >9/pending, BAL: 169, tylenol & ASA levels WNL ?COVID-19 & Influenza A/B: negative ?ABG: 7.22/ 21/ 206/ 8.6 ?UDS: + Marijuana ?UA: +ketones, +pyuria ?CXR 10/04/21: mild bibasilar atelectasis ?CT head wo contrast Oct 04, 2021: Diffuse sulcal effacement and loss of gray-white differentiation throughout the cerebral hemispheres, highly suspicious for cerebral ?edema. ?CT cervical spine wo contrast 04-Oct-2021: No acute fracture or traumatic subluxation of the cervical spine. Straightening of normal lordosis may be due to positioning or muscle spasm. Orogastric tube is looped in the pharynx, but extends into the ?visualized esophagus. ?  ?PCCM consulted for admission due to cardiac arrest with hemodynamic instability requiring mechanical ventilatory support. ? ?Patient was admitted to ICU postcardiac arrest placed on normothermia protocol and vasopressors requiring mechanical ventilation.  There were no brainstem reflexes present at the time of initial evaluation.  CT head showing cerebral edema.  On 02 October 2021 patient showed signs of brain death and severe neurogenic shock.  Apnea test was attempted on 10/07/2021 however was aborted due to blood pressure dropping patient lasted only 3  minutes becoming hemodynamically unstable no respiratory effort noted.  Nuclear blood flow study was then performed  showing absent blood flow and delayed uptake tracer within the brain consistent with brain death.  Patient was pronounced dead by neurological criteria on 03 Oct 2021 at 4:32 PM.  At that point patient was considered for organ donation and [facility] directed ongoing care for organ harvesting. ? ?Pertinent Labs and Studies  ?Significant Diagnostic Studies ?DG Abd 1 View ? ?Result Date: 10/01/2021 ?CLINICAL DATA:  OG tube placement. EXAM: ABDOMEN - 1 VIEW COMPARISON:  Radiograph from September 30, 2021. FINDINGS: Bowel gas pattern not well evaluated. 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 EKG leads project over the patient's chest and abdomen. Pacer pads also project over the upper abdomen. On limited assessment there is no acute skeletal process. IMPRESSION: 1. Feeding tube in the stomach, tip in the distal stomach with side port below GE junction. 2. Signs of situs ambiguous. Electronically Signed   By: Donzetta KohutGeoffrey  Wile M.D.   On: 10/01/2021 08:14  ? ?CT HEAD WO CONTRAST (5MM) ? ?Result Date: 04/23/22 ?CLINICAL DATA:  Sudden onset of severe headache.  Post CPR. EXAM: CT HEAD WITHOUT CONTRAST TECHNIQUE: Contiguous axial images were obtained from the base of the skull through the vertex without intravenous contrast. RADIATION DOSE REDUCTION: This exam was performed according to the departmental dose-optimization program which includes automated exposure control, adjustment of the mA and/or kV according to patient size and/or use of iterative reconstruction technique. COMPARISON:  Head CT 04/05/2019 FINDINGS: Brain: There is diffuse sulcal effacement and loss of gray-white differentiation throughout the cerebral hemispheres. Pseudo subarachnoid hemorrhage with increased density of the  subarachnoid space typical of cerebral edema. No hemorrhage or acute infarct. No subdural collection. Lateral ventricles are smaller than on prior CT. The basilar cisterns remain patent. Vascular: No focal hyperdense vessel. Skull: No fracture or focal lesion. Sinuses/Orbits: Partial opacification of left mastoid air cells. Mucosal thickening throughout the ethmoid air cells and sphenoid sinuses. Other: Left frontal scalp scarring. IMPRESSION: Diffuse sulcal effacement and loss of gray-white differentiation throughout the cerebral hemispheres, highly suspicious for cerebral edema. These results were called by telephone at the time of interpretation on 04/23/22 at 11:32 pm to provider [facility] Center[facility] FUN[facility] , who verbally acknowledged these results. Electronically Signed   By: Narda RutherfordMelanie  Sanford M.D.   On: 011/19/23 23:34  ? ?CT Cervical Spine Wo Contrast ? ?Result Date: 04/23/22 ?CLINICAL DATA:  Ataxia, cervical trauma Post CPR. EXAM: CT CERVICAL SPINE WITHOUT CONTRAST TECHNIQUE: Multidetector CT imaging of the cervical spine was performed without intravenous contrast. Multiplanar CT image reconstructions were also generated. RADIATION DOSE REDUCTION: This exam was performed according to the departmental dose-optimization program which includes automated exposure control, adjustment of the mA and/or kV according to patient size and/or use of iterative reconstruction technique. COMPARISON:  04/05/2019 FINDINGS: Alignment: Straightening of normal lordosis. No traumatic subluxation. Skull base and vertebrae: No acute fracture. No primary bone lesion or focal pathologic process. Soft tissues and spinal canal: No prevertebral fluid or swelling. No visible canal hematoma. Disc levels: Mild anterior spurring at multiple levels. C5-C6 disc space narrowing. Upper chest: Enteric tube in place. Orogastric tube is looped in the pharynx. No acute apical lung findings. Other: None. IMPRESSION: 1. No  acute fracture or traumatic subluxation  of the cervical spine. 2. Straightening of normal lordosis may be due to positioning or muscle spasm. 3. Orogastric tube is looped in the pharynx, but extends into the visualized esophagus. Electronically Signed

## 2021-11-03 NOTE — Progress Notes (Addendum)
? ?NAME:  Jose Winters, MRN:  [id number], DOB:  05-Jun-1976, LOS: 2 ?ADMISSION DATE:  09/25/2021, CONSULTATION DATE:  10/01/21 ?REFERRING MD:  Dr. Fuller Plan, CHIEF COMPLAINT:  Cardiac Arrest  ? ?History of Present Illness:  ?46 yo M presenting to [facility] ED on 09/11/2021 with CPR in progress after becoming unresponsive at a neighbor's house. Sister, [name], who arrived bedside stated she saw her brother earlier in the evening for "a couple of beers" at a local restaurant/bar. She reported he had no complaints, and was in his usual state of health. EMS reported upon their arrival he had pulses and was agonally breathing. Patient deteriorated into PEA, 2 episodes of V-fib with 2 defibrillations, he received 300 mg of amio. With ROSC, EKG was concerning for a STEMI. Pulses were lost again en route. ?ED course: ?Upon arrival ED continued ACLS measures until ROSC. Patient emergently intubated on mechanical ventilation. Concern for ischemia and CODE STEMI called. Patient received TNKase just prior to ROSC due to concern for STEMI. CTH negative for ICH but concerning for anoxic injury. Dr. Darrold Junker assessed the patient bedside, deciding he was not a candidate for cardiac cath at this time.  ?Medications given: TNKase, 1 L IVF, sodium bicarb, ACLS medications, epinephrine & levophed drips started ?Initial Vitals: 94.1, 21, 84, 100/56, 96% on BVM ?Significant labs: (Labs/ Imaging personally reviewed) ?EKG Interpretation: Date: 09/11/2021, EKG Time: 23:36, Rate: 101, Rhythm: NSR, QRS Axis:  normal, Intervals: normal, ST/T Wave abnormalities: non specific T wave inversion leads: I, II/ mild STE in I, II & mild ST depression in V5-V6, Narrative Interpretation: NSR with concern for ischemia ?Chemistry: Na+: 131, K+: 3.1, BUN/Cr.: 9/1.10, Serum CO2/ AG: 18/22, AST: 88, ALT: 54, Ca: 11.7, Phos:11, Mg: 3.1 ?Hematology: WBC: 12.9, Hgb: 13,  ?Troponin: 46 >279 , BNP: 61.4, Lactic/ PCT: >9/pending, BAL: 169, tylenol & ASA levels WNL ?COVID-19 &  Influenza A/B: negative ?ABG: 7.22/ 21/ 206/ 8.6 ?UDS: + Marijuana ?UA: +ketones, +pyuria ?CXR 09/14/2021: mild bibasilar atelectasis ?CT head wo contrast 09/10/2021: Diffuse sulcal effacement and loss of gray-white differentiation throughout the cerebral hemispheres, highly suspicious for cerebral ?edema. ?CT cervical spine wo contrast 09/27/2021: No acute fracture or traumatic subluxation of the cervical spine. Straightening of normal lordosis may be due to positioning or muscle spasm. Orogastric tube is looped in the pharynx, but extends into the ?visualized esophagus. ? ?PCCM consulted for admission due to cardiac arrest with hemodynamic instability requiring mechanical ventilatory support. ?Pertinent  Medical History  ?T2DM ?HTN ?Anxiety ?Current everyday smoker ? ?Significant Hospital Events: ?Including procedures, antibiotic start and stop dates in addition to other pertinent events   ?10/01/21: Admit to ICU post cardiac arrest on normothermia protocol, vasopressors requiring mechanical ventilation. No brainstem reflexes present, CTH showing cerebral edema. ?10-11-2021: Signs of brain death, severe neurogenic shock ?10/03/21: Apnea test had to be aborted due to blood pressure dropping, patient lasted 3 minutes. Will obtain nuclear cerebral blood flow study ? ?Interim History / Subjective:  ?GCS = 3 without any sedation, no brainstem reflexes, pupils: #6 non reactive. ?Sister bedside discussed current plan of care and that apnea test will be performed.  Brain death likely.  All questions and concerns answered at this time. ? ?Objective   ?Blood pressure 104/74, pulse 84, temperature 98.8 ?F (37.1 ?C), resp. rate (!) 30, height 5\' 9"  (1.753 m), weight 105.1 kg, SpO2 98 %. ?   ?Vent Mode: PRVC ?FiO2 (%):  [90 %-100 %] 100 % ?Set Rate:  [30 bmp] 30 bmp ?Vt  Set:  [500 mL] 500 mL ?PEEP:  [5 cmH20] 5 cmH20 ?Plateau Pressure:  [21 cmH20-23 cmH20] 21 cmH20  ? ?Intake/Output Summary (Last 24 hours) at 10/28/2021 1226 ?Last data filed  at 10/20/2021 1000 ?Gross per 24 hour  ?Intake 1965.84 ml  ?Output 5340 ml  ?Net -3374.16 ml  ? ?Filed Weights  ? 10/01/21 0130 10/02/21 0500 10/16/2021 0500  ?Weight: 101.5 kg 106.8 kg 105.1 kg  ? ? ?Examination: ?General: Adult male, unresponsive, lying in bed intubated & sedated requiring mechanical ventilation, no sedation  ?HEENT: MM pink/moist, anicteric, atraumatic, neck supple ?Neuro: GCS = 3, Pupils +6 fixed and non reactive, no cough/gag/corneal reflex.  No response to upper and lower airway stimulation no ocular response to head turning. ?CV: s1s2 RRR, NSR on monitor, no r/m/g ?Pulm: Regular, non labored on PRVC 100 %/PEEP 5, breath sounds diminished throughout ?GI: soft, rounded, bs x 4 ?GU: foley in place with clear yellow urine ?Skin: scattered scabbed abrasions ?Extremities: warm/dry, pulses + 2 R/P, trace edema noted BLE ? ?Resolved Hospital Problem list   ? ? ?Assessment & Plan:  ?Cardiac arrest ?Circulatory shock ?PMHx: Current everyday smoker, HTN ?Est 40-50 minutes downtime, initial rhythm PEA > VF received 2 defibrillations, suspected anoxic injury ? UDS: + Marijuana ?- Completed normothermia protocol ?- Continue vasopressors: levophed & epinephrine PRN, to maintain MAP > 65 ?- Echocardiogram today ?- Trend troponin, lactic ?- Cardiology consulted, appreciate input ?- continuous cardiac monitoring ? ?Acute hypoxic respiratory failure in the setting of cardiac arrest with prolonged downtime and resulting in severe anoxic brain injury ? PMHx: current everyday smoker, OSA? (Per sister, does not wear O2 at home) ?- Ventilator settings: PRVC 8 mL/kg, 100% FiO2, 5 PEEP ?- Wean PEEP and FiO2 for sats greater than 90% ?- Plateau pressures less than 30 cm H20 ?- VAP bundle in place ?- Intermittent chest x-ray & ABG ?- Ensure adequate pulmonary hygiene  ?- F/u cultures, trend PCT ? ?Suspected UTI ?- Continue ceftriaxone IV ?- f/u cultures ?- daily CBC, trend WBC/fever curve ? ?Severe anoxic  encephalopathy. ?40-50 minutes downtime. Initial head CT: demonstrating cerebral edema ?- GCS = 3 ?- MRI 4/29 consistent with severe HIE ?- Neuro consultants agree with dismal prognosis ?- On no sedatives ? ?Diabetes mellitus type 2, and poor control in the setting of cardiac arrest ?DKA resolved ?PMHx: T2DM ?- Continue ICU insulin protocol ? ?Mild Transaminitis secondary to shock liver in the setting of cardiac arrest ?- Trend hepatic function ?- Consider RUQ US/CT abdomen/pelvis as needed ?- avoid hepatotoxic agents ? ?Best Practice (right click and "Reselect all SmartList Selections" daily)  ?Diet/type: NPO w/ meds via tube ?DVT prophylaxis: SCD * patient received TNKase, will hold off on CVC placement as long as possible ?GI prophylaxis: PPI ?Lines: Right femoral A-line ?Foley:  Yes, and it is still needed ?Code Status:  full code ?Last date of multidisciplinary goals of care discussion [10/03/2021] ? ?Labs   ?CBC: ?Recent Labs  ?Lab 2021-06-06 ?2235 2021-06-06 ?2309 10/02/21 ?0450 10/02/21 ?1649 10/08/2021 ?[id number]  ?WBC 10.8* 12.9* 11.4* 9.8 13.8*  ?NEUTROABS  --  5.1  --   --   --   ?HGB 13.0 13.0 12.7* 12.7* 11.5*  ?HCT 42.9 41.6 40.2 40.3 35.7*  ?MCV 105.1* 103.7* 101.8* 101.8* 99.4  ?PLT 281 293 207 194 193  ? ? ? ?Basic Metabolic Panel: ?Recent Labs  ?Lab 2021-06-06 ?2309 2021-06-06 ?2330 10/01/21 ?[id number] 10/01/21 ?[id number] 10/01/21 ?2018 10/02/21 ?0450 10/02/21 ?1649 10/02/21 ?2237 10/30/2021 ?[id number]  10/19/2021 ?1114  ?NA  --    < > 139   < >  --  147* 148* 151* 154* 154*  ?K  --    < > 3.0*   < > 4.6 4.3 4.7 4.7 4.3 4.1  ?CL  --    < > 103   < >  --  114* 115* 120* 121* 121*  ?CO2  --    < > 27   < >  --  27 21* 22 22 25   ?GLUCOSE  --    < > 255*   < >  --  164* 289* 319* 290* 297*  ?BUN  --    < > 12   < >  --  23* 29* 30* 31* 32*  ?CREATININE  --    < > 1.26*   < >  --  2.22* 2.30* 2.03* 1.97* 1.81*  ?CALCIUM  --    < > 8.2*   < >  --  7.6* 7.6* 7.8* 7.9* 8.0*  ?MG 3.1*  --  1.7  --  1.7 1.9  --   --   --   --   ?PHOS 11.0*   --  1.3*  --  6.2* 6.4*  --   --   --   --   ? < > = values in this interval not displayed.  ? ?GFR: ?Estimated Creatinine Clearance: 61.6 mL/min (A) (by C-G formula based on SCr of 1.81 mg/dL (H)). ?Recent Labs  ?Lab 04/2

## 2021-11-03 NOTE — TOC Initial Note (Signed)
Transition of Care (TOC) - Initial/Assessment Note  ? ? ?Patient Details  ?Name: Jose Winters ?MRN: [id number] ?Date of Birth: [birth year] ? ?Transition of Care (TOC) CM/SW Contact:    ?Allayne Butcher, RN ?Phone Number: ?11/02/2021, 10:04 AM ? ?Clinical Narrative:                 ?Patient admitted after cardiac arrest.  Family has decided to withdraw care.  [facility] is following for organ donation and the plan is for brain death testing.   ? ?No TOC needs.  ? ?Expected Discharge Plan: Expired ?Barriers to Discharge: Continued Medical Work up ? ? ?Patient Goals and CMS Choice ?Patient states their goals for this hospitalization and ongoing recovery are:: per bedside RN Howard Pouch would like official brain death testing for donor services ?  ?  ? ?Expected Discharge Plan and Services ?Expected Discharge Plan: Expired ?  ?  ?  ?Living arrangements for the past 2 months: Single Family Home ?                ?  ?  ?  ?  ?  ?  ?  ?  ?  ?  ? ?Prior Living Arrangements/Services ?Living arrangements for the past 2 months: Single Family Home ?Lives with:: Self ?  ?       ?  ?  ?  ?  ? ?Activities of Daily Living ?  ?  ? ?Permission Sought/Granted ?  ?  ?   ?   ?   ?   ? ?Emotional Assessment ?  ?Attitude/Demeanor/Rapport: Unresponsive, Intubated (Following Commands or Not Following Commands) ?  ?  ?Alcohol / Substance Use: Alcohol Use ?Psych Involvement: No (comment) ? ?Admission diagnosis:  Cardiac arrest [facility]) [I46.9] ?Patient Active Problem List  ? Diagnosis Date Noted  ? Brain damage 10/02/2021  ? Acidosis 10/02/2021  ? Neurogenic shock (HCC) 10/02/2021  ? Palliative care status 10/02/2021  ? Cardiac arrest (HCC) 10/01/2021  ? ?PCP:  [facility] ?Pharmacy:   ?[id number] - [place], [place] - [address] [place] [address] AT [address] ?[address] ?[place] [address] ?Phone: [phone] Fax: [phone] ? ? ? ? ?Social Determinants of Health (SDOH) Interventions ?  ? ?Readmission  Risk Interventions ?   ? View : No data to display.  ?  ?  ?  ? ? ? ?

## 2021-11-03 NOTE — Progress Notes (Signed)
Patient in Cardiac cath lab. Sandy RN in cath lab will be taking over during procedure. Continue to assess.  ?

## 2021-11-03 NOTE — Progress Notes (Signed)
Back from Nuclear med testing. Called Dr. Fletcher Anon and he will be arranging for craterization. Per Dr. Patsey Berthold hold on restarting normothermia for procedure. Continue to assess.  ?

## 2021-11-03 DEATH — deceased

## 2023-10-29 IMAGING — NM NM BRAIN 4+V W/ FLOW
3 series · 12 of 12 positions shown · non-contrast
Comparison: MR brain 10/01/2021

CLINICAL DATA: Cardiac arrest

EXAM:
NM BRAIN SCAN WITH FLOW - 4+ VIEW
TECHNIQUE: Radionuclide angiogram and static images of the brain were obtained
after intravenous injection of radiopharmaceutical.
RADIOPHARMACEUTICALS:  20.7 mCi Dc-44m Ceretec IV

[Series 1000: flow · 4.80mm/px · 6 of 15 frames shown]
[frame 2/15]
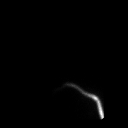
[frame 4/15]
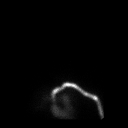
[frame 7/15]
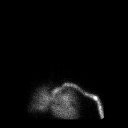
[frame 9/15]
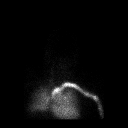
[frame 12/15]
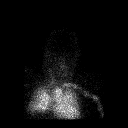
[frame 14/15]
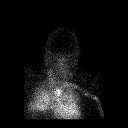

[Series 1000: immediate · 4.80mm/px · 3 of 3 slices shown]
[im 1/3]
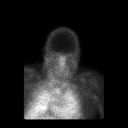
[im 2/3]
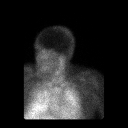
[im 3/3]
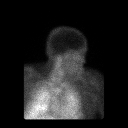

[Series 1000: 10 min delays · 4.80mm/px · 3 of 3 slices shown]
[im 1/3]
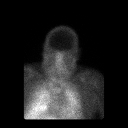
[im 2/3]
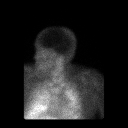
[im 3/3]
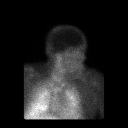

[12 of 12 positions shown; findings below may reference images not displayed]

FINDINGS: Blood flow images show absence of blood flow to the intracranial
circulation.

Delayed images demonstrate absence of tracer uptake within brain
parenchyma.

Findings are consistent with those described in the literature for
cerebral death.
IMPRESSION: Absent blood flow and delayed uptake of tracer within the brain
consistent with brain death.

## 2023-10-30 IMAGING — US IR US GUIDANCE
1 series · 2 of 2 positions shown · non-contrast
Comparison: none

INDICATION: Liver donor biopsy

[Series 1: ir us guidance · 0.15mm/px · 2 of 2 slices shown]
[im 1/2]
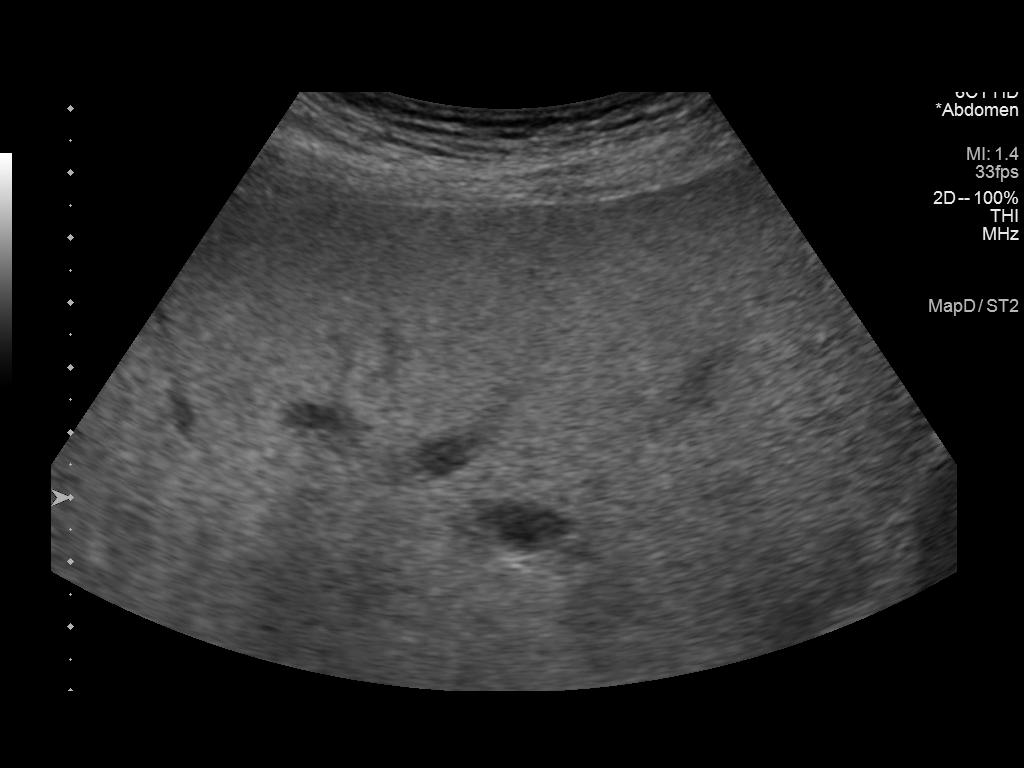
[im 2/2]
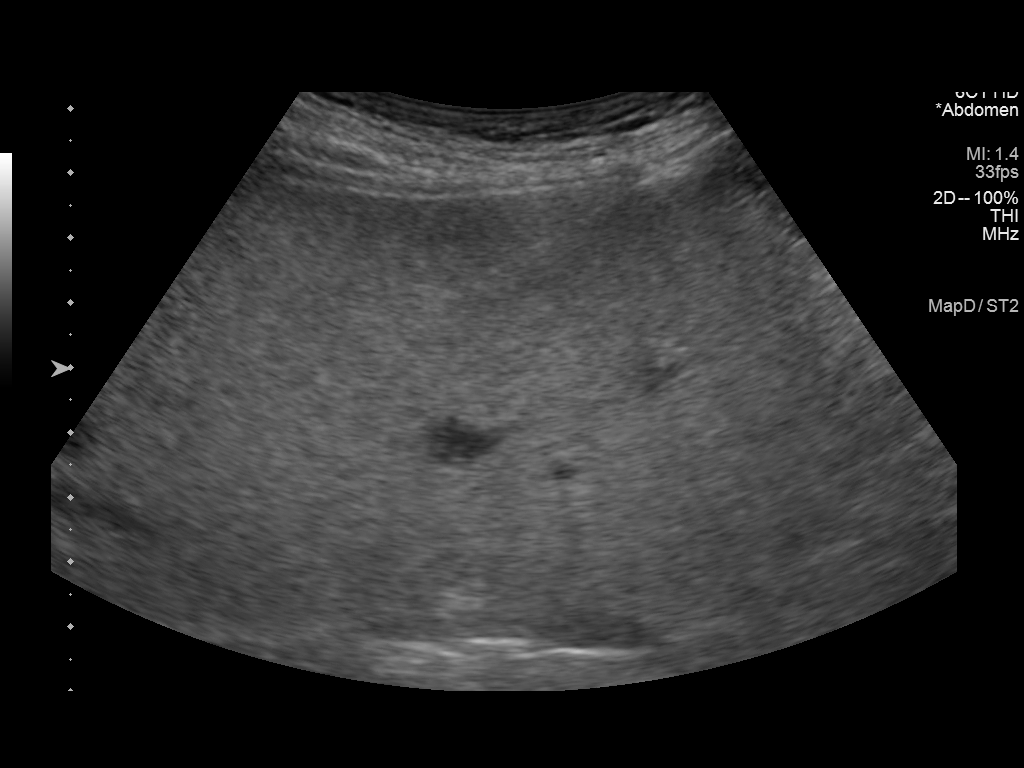

[2 of 2 positions shown; findings below may reference images not displayed]

EXAM:
Ultrasound-guided random liver biopsy

MEDICATIONS:
None.

ANESTHESIA/SEDATION:
None

FLUOROSCOPY TIME:  N/a

COMPLICATIONS:
None immediate.

PROCEDURE:
Informed written consent was obtained from the patient after a
thorough discussion of the procedural risks, benefits and
alternatives. All questions were addressed. Maximal Sterile Barrier
Technique was utilized including caps, mask, sterile gowns, sterile
gloves, sterile drape, hand hygiene and skin antiseptic. A timeout
was performed prior to the initiation of the procedure.

The patient was placed supine on the exam table. Limited ultrasound
of the right upper quadrant was performed for planning purposes. The
left liver lobe was selected as appropriate for percutaneous biopsy
access. Skin entry site was marked, and the overlying skin was
prepped and draped in the standard sterile fashion. Local analgesia
was obtained with 1% lidocaine. Using ultrasound guidance, a 17
gauge introducer needle was advanced just deep to the surface of the
left liver lobe. Subsequently, core needle biopsy was performed
using a 18 gauge core biopsy device x3 total passes. Specimens were
submitted in saline to pathology for further handling. Limited
postprocedure imaging demonstrated expected post biopsy changes
without hematoma. A clean dressing was placed. The patient tolerated
the procedure well without immediate complication.
IMPRESSION: Successful ultrasound-guided random liver core biopsy of the left
hepatic lobe.
